# Patient Record
Sex: Female | Born: 2000
Health system: Southern US, Community
[De-identification: ages and names within clinical notes are randomized; demographics above are authoritative.]

## PROBLEM LIST (undated history)

## (undated) DIAGNOSIS — F988 Other specified behavioral and emotional disorders with onset usually occurring in childhood and adolescence: Secondary | ICD-10-CM

## (undated) DIAGNOSIS — F419 Anxiety disorder, unspecified: Secondary | ICD-10-CM

## (undated) DIAGNOSIS — F329 Major depressive disorder, single episode, unspecified: Secondary | ICD-10-CM

## (undated) DIAGNOSIS — R079 Chest pain, unspecified: Secondary | ICD-10-CM

## (undated) DIAGNOSIS — F32A Depression, unspecified: Secondary | ICD-10-CM

## (undated) DIAGNOSIS — S62109A Fracture of unspecified carpal bone, unspecified wrist, initial encounter for closed fracture: Secondary | ICD-10-CM

## (undated) HISTORY — DX: Anxiety disorder, unspecified: F41.9

## (undated) HISTORY — DX: Fracture of unspecified carpal bone, unspecified wrist, initial encounter for closed fracture: S62.109A

## (undated) HISTORY — DX: Chest pain, unspecified: R07.9

---
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
## 2018-01-05 NOTE — Telephone Encounter (Signed)
Received fax from [employer] that Viibryd was approved from 01/03/2018 through 01/03/2019. Pharmacy notified and forms sent to scan.   Reference ID: [id number].

## 2018-01-12 ENCOUNTER — Encounter: Payer: Self-pay | Admitting: Family Medicine

## 2018-01-12 MED ORDER — VILAZODONE HCL 10 & 20 MG PO KIT: 10.0000 mg | PACK | Freq: Every day | ORAL | 0 refills | Status: DC

## 2018-01-12 NOTE — Addendum Note (Signed)
Addended by: Arvilla Market on: 01/12/2018 11:44 AM   Modules accepted: Orders

## 2018-01-13 ENCOUNTER — Encounter: Payer: Self-pay | Admitting: *Deleted

## 2018-01-18 MED FILL — VIIBRYD 10-20 MG STARTER PA: 10 & 20 | 30 days supply | Qty: 30 | Fill #0

## 2018-02-10 ENCOUNTER — Ambulatory Visit (INDEPENDENT_AMBULATORY_CARE_PROVIDER_SITE_OTHER): Payer: 59 | Admitting: Family Medicine

## 2018-02-10 ENCOUNTER — Encounter: Payer: Self-pay | Admitting: Family Medicine

## 2018-02-10 VITALS — BP 122/60 | HR 90 | Ht 63.0 in | Wt 181.0 lb

## 2018-02-10 DIAGNOSIS — T50905A Adverse effect of unspecified drugs, medicaments and biological substances, initial encounter: Secondary | ICD-10-CM

## 2018-02-10 DIAGNOSIS — F332 Major depressive disorder, recurrent severe without psychotic features: Secondary | ICD-10-CM | POA: Diagnosis not present

## 2018-02-10 NOTE — Progress Notes (Signed)
   Subjective:    Patient ID: Anita Tanner, female    DOB: 03/04/2001, 17 y.o.   MRN: [id number]  HPI 6-week follow-up for major depressive disorder-we recently switched her to Viibryd.  We were fortunately able to get it covered under her insurance about 4 weeks ago.  She did have to withdraw from some of her summer classes because of the transition with her medication she was having some difficulty staying focused. She feels like she zones out.  She has been getting HA as well.  She describes it is a very mild low-grade headache.  She says it is occurring daily.  The most days it is not bad enough that she takes any medication for it.  She has been on the medication now for about 3 weeks total.  She also feels like sometimes she feels almost flat.  She does not quite feel the emotional highs and sometimes it was feels disconnected a little bit.  She has been off for the summer and she is not currently working or going to school.  Is been able to focus and has not had any difficulty with that.  Currently on the 20 mg dose of the Viibryd.  Review of Systems     Objective:   Physical Exam  Constitutional: She is oriented to person, place, and time. She appears well-developed and well-nourished.  HENT:  Head: Normocephalic and atraumatic.  Cardiovascular: Normal rate, regular rhythm and normal heart sounds.  Pulmonary/Chest: Effort normal and breath sounds normal.  Neurological: She is alert and oriented to person, place, and time.  Skin: Skin is warm and dry.  Psychiatric: She has a normal mood and affect. Her behavior is normal.        Assessment & Plan:  Major depressive disorder-right now she is doing okay on the 20 mg we will continue that for now.  Hopefully the headaches will improve over the next couple weeks.  Also if she feels that we need to increase her dose to 40 mg she can give me a call in a couple weeks or send a MyChart notice and we can adjust it.  Otherwise I will see  her back in 6 to 8 weeks.  I like to check back in before she starts school again.  I did provide a letter for her today stating that she is okay to return to fall classes.  Medication side effect-she is having some mild headaches will keep an eye on this.  Hopefully will continue to improve over the next couple weeks but if not we can certainly look at changing the medication if needed.

## 2018-02-20 ENCOUNTER — Encounter: Payer: Self-pay | Admitting: Family Medicine

## 2018-02-21 MED ORDER — VILAZODONE HCL 20 MG PO TABS: 20.0000 mg | ORAL_TABLET | Freq: Every day | ORAL | 1 refills | Status: DC

## 2018-02-21 MED FILL — VIIBRYD 20 MG TABLET: 20 | 30 days supply | Qty: 30 | Fill #0

## 2018-02-21 NOTE — Addendum Note (Signed)
Addended by: Arvilla MarketMABE, HOLDEN on: 02/21/2018 08:27 AM   Modules accepted: Orders

## 2018-03-16 DIAGNOSIS — F330 Major depressive disorder, recurrent, mild: Secondary | ICD-10-CM | POA: Diagnosis not present

## 2018-03-17 MED FILL — VIIBRYD 20 MG TABLET: 20 | 30 days supply | Qty: 30 | Fill #1

## 2018-03-28 ENCOUNTER — Other Ambulatory Visit: Payer: Self-pay | Admitting: Family Medicine

## 2018-03-28 MED FILL — ADDERALL XR 15 MG CAP SA: 15 | 90 days supply | Qty: 90 | Fill #0

## 2018-03-28 NOTE — Telephone Encounter (Signed)
Routed to pcp for signature.Anita Tanner, CMA  

## 2018-03-30 ENCOUNTER — Ambulatory Visit (INDEPENDENT_AMBULATORY_CARE_PROVIDER_SITE_OTHER): Payer: 59 | Admitting: Family Medicine

## 2018-03-30 ENCOUNTER — Encounter: Payer: Self-pay | Admitting: Family Medicine

## 2018-03-30 VITALS — BP 128/66 | HR 91 | Ht 63.0 in | Wt 180.0 lb

## 2018-03-30 DIAGNOSIS — F332 Major depressive disorder, recurrent severe without psychotic features: Secondary | ICD-10-CM

## 2018-03-30 DIAGNOSIS — G43909 Migraine, unspecified, not intractable, without status migrainosus: Secondary | ICD-10-CM

## 2018-03-30 DIAGNOSIS — F909 Attention-deficit hyperactivity disorder, unspecified type: Secondary | ICD-10-CM | POA: Diagnosis not present

## 2018-03-30 NOTE — Progress Notes (Signed)
Subjective:    Patient ID: Anita Tanner, female    DOB: 2000/12/14, 17 y.o.   MRN: [id number]  HPI Follow-up major depressive disorder-she is now currently on Viibryd.  She decided to stick with the 20 mg and feels like overall she is been doing well.  She moved back into the dorms in about a week and will start her fall semester of college.  She has not had any negative side effects that she has been having more frequent headaches she is not sure if it is related to the medicine or not.  ADHD - Reports symptoms are well controlled on current regime. Denies any problems with insomnia, chest pain, palpitations, or SOB.    Follow-up migraine headaches-she has been complaining of more frequent daily headaches not quite as intense as her usual migraines but they just seem to be low-grade and they are all day.  More so in this last week.  No specific triggers though she admits she often skips meals during the day and may not hydrate as well as she should.  Review of Systems BP 128/66   Pulse 91   Ht 5\' 3"  (1.6 m)   Wt 180 lb (81.6 kg)   SpO2 99%   BMI 31.89 kg/m     Allergies  Allergen Reactions  . Prozac [Fluoxetine Hcl] Other (See Comments)    Weight gain    Past Medical History:  Diagnosis Date  . ADD (attention deficit disorder)   . Depression   . Fracture of wrist    left 2008, bilat 2011, right 2013    Past Surgical History:  Procedure Laterality Date  . TYMPANOSTOMY TUBE PLACEMENT  2002    Social History   Socioeconomic History  . Marital status: Single    Spouse name: Not on file  . Number of children: Not on file  . Years of education: Not on file  . Highest education level: Not on file  Occupational History  . Not on file  Social Needs  . Financial resource strain: Not on file  . Food insecurity:    Worry: Not on file    Inability: Not on file  . Transportation needs:    Medical: Not on file    Non-medical: Not on file  Tobacco Use  . Smoking  status: Never Smoker  . Smokeless tobacco: Never Used  Substance and Sexual Activity  . Alcohol use: No  . Drug use: No  . Sexual activity: Yes    Birth control/protection: Pill  Lifestyle  . Physical activity:    Days per week: Not on file    Minutes per session: Not on file  . Stress: Not on file  Relationships  . Social connections:    Talks on phone: Not on file    Gets together: Not on file    Attends religious service: Not on file    Active member of club or organization: Not on file    Attends meetings of clubs or organizations: Not on file    Relationship status: Not on file  . Intimate partner violence:    Fear of current or ex partner: Not on file    Emotionally abused: Not on file    Physically abused: Not on file    Forced sexual activity: Not on file  Other Topics Concern  . Not on file  Social History Narrative   No sexually active.  In new school.      Family History  Problem  Relation Age of Onset  . Coronary artery disease Father   . ADD / ADHD Father     Outpatient Encounter Medications as of 03/30/2018  Medication Sig  . amphetamine-dextroamphetamine (ADDERALL XR) 15 MG 24 hr capsule TAKE 1 CAPSULE BY MOUTH EVERY MORNING.  . clindamycin-benzoyl peroxide (BENZACLIN) gel Apply topically 2 (two) times daily.  Marland Kitchen. ipratropium (ATROVENT) 0.06 % nasal spray Place 1 spray into both nostrils 4 (four) times daily as needed.  . Multiple Vitamin (MULTIVITAMIN WITH MINERALS) TABS tablet Take 1 tablet by mouth daily.  . Vilazodone HCl 20 MG TABS Take 1 tablet (20 mg total) by mouth daily.   No facility-administered encounter medications on file as of 03/30/2018.          Objective:   Physical Exam  Constitutional: She is oriented to person, place, and time. She appears well-developed and well-nourished.  HENT:  Head: Normocephalic and atraumatic.  Cardiovascular: Normal rate, regular rhythm and normal heart sounds.  Pulmonary/Chest: Effort normal and breath  sounds normal.  Neurological: She is alert and oriented to person, place, and time.  Skin: Skin is warm and dry.  Psychiatric: She has a normal mood and affect. Her behavior is normal.       Assessment & Plan:  Depressive disorder-PHQ 9 score of 7 and gad 7 score of 3.  We discussed options she wants to continue with her current dosing regimen.  Follow-up in 3 months.  She can always send me a note through my chart if she decides that she needs an increased dose once she is back into school and under typical pressures and stressors of her classes.  ADHD -  Doing well on current regimen. F/U in 3 mo. 90-day supply was refilled yesterday.  Migraine headaches-it sounds like she is getting more tension type headaches on top of her migraines.  We discussed prophylaxis as an option.  But right now I really want her to focus on lifestyle including eating more regularly, hydrating well, staying active, getting adequate sleep and going to bed at the same time.  She agreed that focusing on that first would be her priority as well.

## 2018-04-21 ENCOUNTER — Other Ambulatory Visit: Payer: Self-pay | Admitting: Family Medicine

## 2018-04-22 MED FILL — VIIBRYD 20 MG TABLET: 20 | 30 days supply | Qty: 30 | Fill #0

## 2018-05-18 MED FILL — VIIBRYD 20 MG TABLET: 20 | 30 days supply | Qty: 30 | Fill #1

## 2018-06-03 MED FILL — CLINDAMYCIN PHOS-BENZOYL PE: 1-5 | 30 days supply | Qty: 50 | Fill #1

## 2018-06-27 ENCOUNTER — Other Ambulatory Visit: Payer: Self-pay | Admitting: Family Medicine

## 2018-06-27 MED FILL — VIIBRYD 20 MG TABLET: 20 | 30 days supply | Qty: 30 | Fill #0

## 2018-06-30 ENCOUNTER — Ambulatory Visit: Payer: 59 | Admitting: Family Medicine

## 2018-06-30 ENCOUNTER — Encounter: Payer: Self-pay | Admitting: Family Medicine

## 2018-06-30 VITALS — BP 115/56 | HR 92 | Ht 62.21 in | Wt 177.0 lb

## 2018-06-30 DIAGNOSIS — Z23 Encounter for immunization: Secondary | ICD-10-CM

## 2018-06-30 DIAGNOSIS — F909 Attention-deficit hyperactivity disorder, unspecified type: Secondary | ICD-10-CM

## 2018-06-30 DIAGNOSIS — F332 Major depressive disorder, recurrent severe without psychotic features: Secondary | ICD-10-CM

## 2018-06-30 MED ORDER — AMPHETAMINE-DEXTROAMPHET ER 15 MG PO CP24: ORAL_CAPSULE | ORAL | 0 refills | Status: DC

## 2018-06-30 MED FILL — ADDERALL XR 15 MG CAP SA: 15 | 90 days supply | Qty: 90 | Fill #0

## 2018-06-30 NOTE — Progress Notes (Signed)
Subjective:    CC:   HPI: ADHD - Reports symptoms are well controlled on current regime. Denies any problems with insomnia, chest pain, palpitations, or SOB.  She does need refills on her medication.  Is getting close to the end of the school semester and she is been under a lot of stress.  Major  depressive disorder-she feels like she is actually doing well on the Viibryd 20 mg.  She is happy with her regimen even though she does feel like her stress levels have increased.  She more recently has started working out and has been going to the gym with a friend on Wednesdays she is actually dropped about 3 pounds since she was here last time.    Past medical history, Surgical history, Family history not pertinant except as noted below, Social history, Allergies, and medications have been entered into the medical record, reviewed, and corrections made.   Review of Systems: No fevers, chills, night sweats, weight loss, chest pain, or shortness of breath.   Objective:    General: Well Developed, well nourished, and in no acute distress.  Neuro: Alert and oriented x3, extra-ocular muscles intact, sensation grossly intact.  HEENT: Normocephalic, atraumatic  Skin: Warm and dry, no rashes. Cardiac: Regular rate and rhythm, no murmurs rubs or gallops, no lower extremity edema.  Respiratory: Clear to auscultation bilaterally. Not using accessory muscles, speaking in full sentences.   Impression and Recommendations:    ADHD -doing well on current regimen.  Blood pressure under control.  Refilled Adderall today for 90-day supply.  Follow-up in 3 months.  MDD -overall doing well.  Just encouraged her to really push through the rest of the semester and do her best.  I am glad that she is exercising and working out and glad she is lost a few pounds this is fantastic.

## 2018-07-25 MED FILL — VIIBRYD 20 MG TABLET: 20 | 30 days supply | Qty: 30 | Fill #1

## 2018-08-03 ENCOUNTER — Encounter: Payer: Self-pay | Admitting: Family Medicine

## 2018-08-07 MED ORDER — BUPROPION HCL ER (XL) 150 MG PO TB24: 150.0000 mg | ORAL_TABLET | ORAL | 2 refills | Status: DC

## 2018-08-07 NOTE — Addendum Note (Signed)
Addended by: Nani GasserMETHENEY, Lakita D on: 08/07/2018 10:13 AM   Modules accepted: Orders

## 2018-08-08 MED FILL — buPROPion HCL ER (XL) 150 M: 150 | 30 days supply | Qty: 30 | Fill #0

## 2018-08-19 ENCOUNTER — Ambulatory Visit (INDEPENDENT_AMBULATORY_CARE_PROVIDER_SITE_OTHER): Payer: 59 | Admitting: Physician Assistant

## 2018-08-19 ENCOUNTER — Encounter: Payer: Self-pay | Admitting: Physician Assistant

## 2018-08-19 VITALS — BP 115/73 | HR 95 | Temp 98.7°F | Wt 182.0 lb

## 2018-08-19 DIAGNOSIS — H6501 Acute serous otitis media, right ear: Secondary | ICD-10-CM

## 2018-08-19 DIAGNOSIS — J069 Acute upper respiratory infection, unspecified: Secondary | ICD-10-CM | POA: Diagnosis not present

## 2018-08-19 MED ORDER — AMOXICILLIN 875 MG PO TABS: 875.0000 mg | ORAL_TABLET | Freq: Two times a day (BID) | ORAL | 0 refills | Status: AC

## 2018-08-19 MED ORDER — GUAIFENESIN ER 600 MG PO TB12: 1200.0000 mg | ORAL_TABLET | Freq: Two times a day (BID) | ORAL | Status: DC

## 2018-08-19 MED ORDER — IPRATROPIUM BROMIDE 0.06 % NA SOLN: 1.0000 | Freq: Four times a day (QID) | NASAL | 0 refills | Status: DC | PRN

## 2018-08-19 MED ORDER — HYDROCOD POLST-CPM POLST ER 10-8 MG/5ML PO SUER: 5.0000 mL | Freq: Two times a day (BID) | ORAL | 0 refills | Status: AC | PRN

## 2018-08-19 MED FILL — HYDROCODONE-CHLORPHEN ER SU: 10-8 | 5 days supply | Qty: 50 | Fill #0

## 2018-08-19 MED FILL — IPRATROPIUM 0.06% SPRAY: 0.06 | 21 days supply | Qty: 15 | Fill #0

## 2018-08-19 MED FILL — AMOXICILLIN 875 MG TABS: 875 | 7 days supply | Qty: 14 | Fill #0

## 2018-08-19 NOTE — Progress Notes (Signed)
HPI:                                                                Anita Tanner is a 17 y.o. female [facility] today for URI symptoms  Cough  This is a new problem. The current episode started in the past 7 days. The problem has been unchanged. Associated symptoms include ear congestion (b/l), nasal congestion and postnasal drip. Nothing aggravates the symptoms. She has tried OTC cough suppressant and rest for the symptoms. There is no history of asthma or COPD.     Past Medical History:  Diagnosis Date  . ADD (attention deficit disorder)   . Depression   . Fracture of wrist    left 2008, bilat 2011, right 2013   Past Surgical History:  Procedure Laterality Date  . TYMPANOSTOMY TUBE PLACEMENT  2002   Social History   Tobacco Use  . Smoking status: Never Smoker  . Smokeless tobacco: Never Used  Substance Use Topics  . Alcohol use: No   family history includes ADD / ADHD in her father; Coronary artery disease in her father.    ROS: negative except as noted in the HPI  Medications: Current Outpatient Medications  Medication Sig Dispense Refill  . amoxicillin (AMOXIL) 875 MG tablet Take 1 tablet (875 mg total) by mouth 2 (two) times daily for 7 days. 14 tablet 0  . amphetamine-dextroamphetamine (ADDERALL XR) 15 MG 24 hr capsule TAKE 1 CAPSULE BY MOUTH EVERY MORNING. 90 capsule 0  . buPROPion (WELLBUTRIN XL) 150 MG 24 hr tablet Take 1 tablet (150 mg total) by mouth every morning. 30 tablet 2  . chlorpheniramine-HYDROcodone (TUSSIONEX) 10-8 MG/5ML SUER Take 5 mLs by mouth every 12 (twelve) hours as needed for up to 5 days for cough. 50 mL 0  . clindamycin-benzoyl peroxide (BENZACLIN) gel Apply topically 2 (two) times daily. 35 g PRN  . guaiFENesin (MUCINEX) 600 MG 12 hr tablet Take 2 tablets (1,200 mg total) by mouth 2 (two) times daily.    Marland Kitchen. ipratropium (ATROVENT) 0.06 % nasal spray Place 1 spray into  both nostrils 4 (four) times daily as needed. 15 mL 0  . Multiple Vitamin (MULTIVITAMIN WITH MINERALS) TABS tablet Take 1 tablet by mouth daily.    Marland Kitchen. VIIBRYD 20 MG TABS TAKE 1 TABLET (20 MG TOTAL) BY MOUTH DAILY. 30 tablet 1   No current facility-administered medications for this visit.    Allergies  Allergen Reactions  . Prozac [Fluoxetine Hcl] Other (See Comments)    Weight gain       Objective:  BP 115/73   Pulse 95   Temp 98.7 F (37.1 C) (Oral)   Wt 182 lb (82.6 kg)   SpO2 97%   BMI 33.07 kg/m  Gen:  alert, not ill-appearing, no distress, appropriate for age HEENT: head normocephalic without obvious abnormality, conjunctiva and cornea clear, right TM is red nonbulging, left TM pearly gray and semitransparent, external ear canals normal bilaterally, oropharynx clear, no exudates, uvula midline, neck supple, no cervical adenopathy trachea midline Pulm: Normal work of breathing, normal phonation, clear to auscultation bilaterally, no wheezes, rales or rhonchi CV: Normal rate, regular rhythm, s1 and s2 distinct, no murmurs, clicks or  rubs  Neuro: alert and oriented x 3, no tremor MSK: extremities atraumatic, normal gait and station Skin: intact, no rashes on exposed skin, no jaundice, no cyanosis   No results found for this or any previous visit (from the past 72 hour(s)). No results found.    Assessment and Plan: 17 y.o. female with   .Anita Tanner was seen today for cough.  Diagnoses and all orders for this visit:  Non-recurrent acute serous otitis media of right ear -     amoxicillin (AMOXIL) 875 MG tablet; Take 1 tablet (875 mg total) by mouth 2 (two) times daily for 7 days.  Acute upper respiratory infection -     ipratropium (ATROVENT) 0.06 % nasal spray; Place 1 spray into both nostrils 4 (four) times daily as needed. -     chlorpheniramine-HYDROcodone (TUSSIONEX) 10-8 MG/5ML SUER; Take 5 mLs by mouth every 12 (twelve) hours as needed for up to 5 days for  cough. -     guaiFENesin (MUCINEX) 600 MG 12 hr tablet; Take 2 tablets (1,200 mg total) by mouth 2 (two) times daily.   Afebrile, no tachypnea, no tachycardia, pulse ox 97% on room air at rest, no adventitious lung sounds Treating for early otitis media with amoxicillin Counseled on supportive care for cough Continue Mucinex Tussionex for nocturnal cough  Patient education and anticipatory guidance given Patient agrees with treatment plan Follow-up as needed if symptoms worsen or fail to improve  Levonne Hubertharley E. Mendy Chou PA-C

## 2018-08-19 NOTE — Patient Instructions (Signed)
For nasal symptoms/sinusitis: - nasal saline rinses / netti pot several times per day (do this prior to nasal spray) - prescription Atrovent nasal spray: 2 sprays each nostril, up to 4 times per day as needed - you can use OTC nasal decongestant sprays like Afrin (oxymetazoline) BUT do not use for more than 3 days as it will cause worsening congestion/nasal symptoms) - warm facial compresses - oral decongestants and antihistamines like Claritin-D and Zyrtec-D may help dry up secretions (caution using decongestants if you have high blood pressure, heart disease or kidney disease) - for sinus headache: Tylenol 1000mg  every 8 hours as needed. Alternate with Ibuprofen 600mg  every 6 hours  For sore throat: - Tylenol 1000mg  every 8 hours as needed for throat pain. Alternate with Ibuprofen 600mg  every 6 hours - Cepacol throat lozenges and/or Chloraseptic spray - Warm salt water gargles  For cough: - Cough is a protective mechanism and an important part of fighting an infection. I encourage you to avoid suppressing your cough with medication during the day if possible.  - Mucinex with at least 8 oz. of water can loosen chest congestion and make cough more productive, which means you will actually cough less - Okay to use a cough suppressant at bedtime in order to rest   Note: follow package instructions for all over-the-counter medications. If using multi-symptom medications (Dayquil, Theraflu, etc.), check the label for duplicate drug ingredients.   Otitis Media, Adult  Otitis media occurs when there is inflammation and fluid in the middle ear. Your middle ear is a part of the ear that contains bones for hearing as well as air that helps send sounds to your brain. What are the causes? This condition is caused by a blockage in the eustachian tube. This tube drains fluid from the ear to the back of the nose (nasopharynx). A blockage in this tube can be caused by an object or by swelling (edema) in  the tube. Problems that can cause a blockage include:  A cold or other upper respiratory infection.  Allergies.  An irritant, such as tobacco smoke.  Enlarged adenoids. The adenoids are areas of soft tissue located high in the back of the throat, behind the nose and the roof of the mouth.  A mass in the nasopharynx.  Damage to the ear caused by pressure changes (barotrauma). What are the signs or symptoms? Symptoms of this condition include:  Ear pain.  A fever.  Decreased hearing.  A headache.  Tiredness (lethargy).  Fluid leaking from the ear.  Ringing in the ear. How is this diagnosed? This condition is diagnosed with a physical exam. During the exam your health care provider will use an instrument called an otoscope to look into your ear and check for redness, swelling, and fluid. He or she will also ask about your symptoms. Your health care provider may also order tests, such as:  A test to check the movement of the eardrum (pneumatic otoscopy). This test is done by squeezing a small amount of air into the ear.  A test that changes air pressure in the middle ear to check how well the eardrum moves and whether the eustachian tube is working (tympanogram). How is this treated? This condition usually goes away on its own within 3-5 days. But if the condition is caused by a bacteria infection and does not go away own its own, or keeps coming back, your health care provider may:  Prescribe antibiotic medicines to treat the infection.  Prescribe  or recommend medicines to control pain. Follow these instructions at home:  Take over-the-counter and prescription medicines only as told by your health care provider.  If you were prescribed an antibiotic medicine, take it as told by your health care provider. Do not stop taking the antibiotic even if you start to feel better.  Keep all follow-up visits as told by your health care provider. This is important. Contact a health  care provider if:  You have bleeding from your nose.  There is a lump on your neck.  You are not getting better in 5 days.  You feel worse instead of better. Get help right away if:  You have severe pain that is not controlled with medicine.  You have swelling, redness, or pain around your ear.  You have stiffness in your neck.  A part of your face is paralyzed.  The bone behind your ear (mastoid) is tender when you touch it.  You develop a severe headache. Summary  Otitis media is redness, soreness, and swelling of the middle ear.  This condition usually goes away on its own within 3-5 days.  If the problem does not go away in 3-5 days, your health care provider may prescribe or recommend medicines to treat your symptoms.  If you were prescribed an antibiotic medicine, take it as told by your health care provider. This information is not intended to replace advice given to you by your health care provider. Make sure you discuss any questions you have with your health care provider. Document Released: 05/15/2004 Document Revised: 07/31/2016 Document Reviewed: 07/31/2016 Elsevier Interactive Patient Education  2019 [employer]

## 2018-09-12 ENCOUNTER — Other Ambulatory Visit: Payer: Self-pay | Admitting: Family Medicine

## 2018-09-12 MED FILL — VIIBRYD 20 MG TABLET: 20 | 90 days supply | Qty: 90 | Fill #0

## 2018-09-12 MED FILL — buPROPion HCL ER (XL) 150 M: 150 | 30 days supply | Qty: 30 | Fill #1

## 2018-09-27 ENCOUNTER — Encounter: Payer: Self-pay | Admitting: Family Medicine

## 2018-09-27 ENCOUNTER — Ambulatory Visit (INDEPENDENT_AMBULATORY_CARE_PROVIDER_SITE_OTHER): Payer: 59 | Admitting: Family Medicine

## 2018-09-27 VITALS — BP 138/76 | HR 76 | Ht 62.0 in | Wt 183.0 lb

## 2018-09-27 DIAGNOSIS — N898 Other specified noninflammatory disorders of vagina: Secondary | ICD-10-CM | POA: Diagnosis not present

## 2018-09-27 DIAGNOSIS — F909 Attention-deficit hyperactivity disorder, unspecified type: Secondary | ICD-10-CM | POA: Diagnosis not present

## 2018-09-27 DIAGNOSIS — E663 Overweight: Secondary | ICD-10-CM | POA: Diagnosis not present

## 2018-09-27 DIAGNOSIS — F332 Major depressive disorder, recurrent severe without psychotic features: Secondary | ICD-10-CM

## 2018-09-27 MED ORDER — DOXYCYCLINE HYCLATE 100 MG PO TABS: 100.0000 mg | ORAL_TABLET | Freq: Two times a day (BID) | ORAL | 0 refills | Status: DC

## 2018-09-27 MED FILL — DOXYCYCLINE HYCLATE 100 MG: 100 | 10 days supply | Qty: 20 | Fill #0

## 2018-09-27 NOTE — Progress Notes (Signed)
Subjective:    CC: F/U Mood and ADHD  HPI: ADHD - Reports symptoms are well controlled on current regime. Denies any problems with insomnia, chest pain, palpitations, or SOB.  Has been a little stressed and overwhelmed with school.  She has 2 projects in a test next week and is just feeling a little overwhelmed she has not made a lot of progress on the projects.  Otherwise she does feel like her medication is working and it is effective.  Major depressive disorder-doing really well on Viibryd.  She is happy with her regimen overall.  She is still unhappy with her weight.  She really has not lost any weight since I last saw her.  But she also admits that she has not been working out like she was and she is still struggling with figuring out what she should eat and a good balance of foods.  Reports that she is had a vaginal odor for months.  She is not had any abnormal discharge or irritation.  She says she washes regularly.  She stays away from douching.  But just has noticed an increased odor.   Past medical history, Surgical history, Family history not pertinant except as noted below, Social history, Allergies, and medications have been entered into the medical record, reviewed, and corrections made.   Review of Systems: No fevers, chills, night sweats, weight loss, chest pain, or shortness of breath.   Objective:    General: Well Developed, well nourished, and in no acute distress.  Neuro: Alert and oriented x3, extra-ocular muscles intact, sensation grossly intact.  HEENT: Normocephalic, atraumatic  Skin: Warm and dry, no rashes. Cardiac: Regular rate and rhythm, no murmurs rubs or gallops, no lower extremity edema.  Respiratory: Clear to auscultation bilaterally. Not using accessory muscles, speaking in full sentences.   Impression and Recommendations:    ADHD - Stable. On current regimen.    MDD - Stable on current regimen.    Vaginal odor-we will do a wet prep as well as check  for GC and chlamydia.  Overweight -we discussed options including referral to a nutritionist versus referral to the bariatric center.  She says right now she is just not sure about the time commitment that it may take.  She does plan on getting back into her exercise routine and says she knows she needs to work on that.  She has a couple of friends that she does exercise with.  We also discussed not skipping meals and maybe even using things like a protein drink to help supplement.  She does not often eat breakfast.

## 2018-09-28 LAB — WET PREP FOR TRICH, YEAST, CLUE
MICRO NUMBER: 148943
SPECIMEN QUALITY 3963: ADEQUATE

## 2018-09-28 LAB — C. TRACHOMATIS/N. GONORRHOEAE RNA
C. trachomatis RNA, TMA: NOT DETECTED
N. gonorrhoeae RNA, TMA: NOT DETECTED

## 2018-10-18 MED FILL — buPROPion HCL ER (XL) 150 M: 150 | 30 days supply | Qty: 30 | Fill #2

## 2018-10-24 ENCOUNTER — Other Ambulatory Visit: Payer: Self-pay | Admitting: Family Medicine

## 2018-11-04 MED FILL — ADDERALL XR 15 MG CAP SA: 15 | 90 days supply | Qty: 90 | Fill #0

## 2018-11-22 ENCOUNTER — Encounter: Payer: Self-pay | Admitting: Family Medicine

## 2018-11-22 MED ORDER — BUPROPION HCL ER (XL) 150 MG PO TB24: 150.0000 mg | ORAL_TABLET | ORAL | 0 refills | Status: DC

## 2018-11-22 MED FILL — buPROPion HCL ER (XL) 150 M: 150 | 90 days supply | Qty: 90 | Fill #0

## 2019-01-11 MED FILL — VIIBRYD 20 MG TABLET: 20 | 90 days supply | Qty: 90 | Fill #1

## 2019-01-26 ENCOUNTER — Encounter: Payer: Self-pay | Admitting: Family Medicine

## 2019-01-26 ENCOUNTER — Telehealth (INDEPENDENT_AMBULATORY_CARE_PROVIDER_SITE_OTHER): Payer: 59 | Admitting: Family Medicine

## 2019-01-26 DIAGNOSIS — F332 Major depressive disorder, recurrent severe without psychotic features: Secondary | ICD-10-CM

## 2019-01-26 DIAGNOSIS — F909 Attention-deficit hyperactivity disorder, unspecified type: Secondary | ICD-10-CM | POA: Diagnosis not present

## 2019-01-26 DIAGNOSIS — L709 Acne, unspecified: Secondary | ICD-10-CM | POA: Diagnosis not present

## 2019-01-26 MED ORDER — VILAZODONE HCL 20 MG PO TABS: ORAL_TABLET | ORAL | 1 refills | Status: DC

## 2019-01-26 MED ORDER — CLINDAMYCIN PHOS-BENZOYL PEROX 1-5 % EX GEL: Freq: Two times a day (BID) | CUTANEOUS | 99 refills | Status: DC

## 2019-01-26 MED ORDER — AMPHETAMINE-DEXTROAMPHET ER 15 MG PO CP24: ORAL_CAPSULE | ORAL | 0 refills | Status: DC

## 2019-01-26 MED FILL — CLINDAMYCIN PHOS-BENZOYL PE: 1-5 | 30 days supply | Qty: 50 | Fill #0

## 2019-01-26 NOTE — Progress Notes (Signed)
Virtual Visit via Video Note  I connected with Anita Tanner on 01/26/19 at  1:40 PM EDT by a video enabled telemedicine application and verified that I am speaking with the correct person using two identifiers.   I discussed the limitations of evaluation and management by telemedicine and the availability of in person appointments. The patient expressed understanding and agreed to proceed.  Pt was at home and I was in my office for the virtual visit.     Subjective:    CC: ADHD  HPI: F/U MDD - she is doing well on her Viibryd and her Wellbutrin  ADHD - Reports symptoms are well controlled on current regime. Denies any problems with insomnia, chest pain, palpitations, or SOB.    Acne - acne has been worse for awhile.  She needs a refill on the benzaclin gel. Doesn't use it daily bc it is really drying.  More on her cheeks or her forehead. Uses cetaphil.   Past medical history, Surgical history, Family history not pertinant except as noted below, Social history, Allergies, and medications have been entered into the medical record, reviewed, and corrections made.   Review of Systems: No fevers, chills, night sweats, weight loss, chest pain, or shortness of breath.   Objective:    General: Speaking clearly in complete sentences without any shortness of breath.  Alert and oriented x3.  Normal judgment. No apparent acute distress. Well groomed.     Impression and Recommendations:   ADHD -doing really well.  We will go ahead and send refill to pharmacy for the next 90 days.  She says she may not take it every single day over the summer but she has been taking it most days.  Acne -little bit of breakout particularly over the cheeks and the forehead.  We discussed that we could always do a low-dose Doxy for couple weeks if needed.  She is going to try to use the gel just a little bit more often though it is extremely drying.  Also encouraged her to make sure she is moisturizing well  there is a good line of Cetaphil products that can be used.  She says she has used those before.  MDD -would overall is okay.  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  But she is getting ready to go on vacation with her father to the beach and she is looking forward to that.  She is happy with her current regimen and would like to continue that.      I discussed the assessment and treatment plan with the patient. The patient was provided an opportunity to ask questions and all were answered. The patient agreed with the plan and demonstrated an understanding of the instructions.   The patient was advised to call back or seek an in-person evaluation if the symptoms worsen or if the condition fails to improve as anticipated.   [name], MD

## 2019-01-26 NOTE — Progress Notes (Signed)
Pt is doing well on current regimen.Jaquanna Ballentine Lynetta, CMA  

## 2019-02-07 ENCOUNTER — Encounter: Payer: Self-pay | Admitting: Family Medicine

## 2019-02-16 MED FILL — ADDERALL XR 15 MG CAP SA: 15 | 90 days supply | Qty: 90 | Fill #0

## 2019-03-22 ENCOUNTER — Other Ambulatory Visit: Payer: Self-pay

## 2019-03-22 MED ORDER — BUPROPION HCL ER (XL) 150 MG PO TB24: 150.0000 mg | ORAL_TABLET | ORAL | 0 refills | Status: DC

## 2019-03-22 MED FILL — buPROPion HCL ER (XL) 150 M: 150 | 90 days supply | Qty: 90 | Fill #0

## 2019-04-13 ENCOUNTER — Other Ambulatory Visit: Payer: Self-pay | Admitting: Family Medicine

## 2019-04-13 MED FILL — VIIBRYD 20 MG TABLET: 20 | 90 days supply | Qty: 90 | Fill #0

## 2019-04-13 NOTE — Telephone Encounter (Signed)
Called and verified and it is for the 20mg  so denied this rx as they have refillls. KG LPN

## 2019-04-13 NOTE — Telephone Encounter (Signed)
Can we please clarify.  This prescription is for a starter pack but per my records she should already be taking 20 mg daily and a 47-month supply was sent in June.  So she should have enough until December.

## 2019-05-16 ENCOUNTER — Encounter: Payer: Self-pay | Admitting: Family Medicine

## 2019-05-16 ENCOUNTER — Telehealth: Payer: 59 | Admitting: Physician Assistant

## 2019-05-16 DIAGNOSIS — J029 Acute pharyngitis, unspecified: Secondary | ICD-10-CM

## 2019-05-16 DIAGNOSIS — R0981 Nasal congestion: Secondary | ICD-10-CM

## 2019-05-16 MED ORDER — IPRATROPIUM BROMIDE 0.03 % NA SOLN: 2.0000 | Freq: Three times a day (TID) | NASAL | 12 refills | Status: AC | PRN

## 2019-05-16 MED ORDER — PHENOL 1.4 % MT LIQD: 1.0000 | OROMUCOSAL | 0 refills | Status: DC | PRN

## 2019-05-16 MED FILL — IPRATROPIUM 0.03% SPRAY: 0.03 | 29 days supply | Qty: 30 | Fill #0

## 2019-05-16 NOTE — Progress Notes (Signed)
We are sorry you are not feeling well.  Here is how we plan to help!  Based on what you have shared with me, it looks like you may have a viral upper respiratory infection.  Upper respiratory infections are caused by a large number of viruses; however, rhinovirus is the most common cause.  *Your symptoms could suggest possible COVID-19 infection.  Many health care providers can now test patients at their office but not all are.  [facility] has multiple testing sites. For information on our COVID testing locations and hours go to [email or web address]   You can also call  [phone] for further information regarding local testing options offered through [facility].    Symptoms vary from person to person, with common symptoms including sore throat, cough, fatigue or lack of energy and feeling of general discomfort.  A low-grade fever of up to 100.4 may present, but is often uncommon.  Symptoms vary however, and are closely related to a person's age or underlying illnesses.  The most common symptoms associated with an upper respiratory infection are nasal discharge or congestion, cough, sneezing, headache and pressure in the ears and face.  These symptoms usually persist for about 3 to 10 days, but can last up to 2 weeks.  It is important to know that upper respiratory infections do not cause serious illness or complications in most cases.    Upper respiratory infections can be transmitted from person to person, with the most common method of transmission being a person's hands.  The virus is able to live on the skin and can infect other persons for up to 2 hours after direct contact.  Also, these can be transmitted when someone coughs or sneezes; thus, it is important to cover the mouth to reduce this risk.  To keep the spread of the illness at [place], good hand hygiene is very important.  This is an infection that is most likely caused by a virus. There are no specific  treatments other than to help you with the symptoms until the infection runs its course.  We are sorry you are not feeling well.  Here is how we plan to help!   For nasal congestion, you may use an oral decongestants such as Mucinex D or if you have glaucoma or high blood pressure use plain Mucinex.  Saline nasal spray or nasal drops can help and can safely be used as often as needed for congestion.  For your congestion, I have prescribed Ipratropium Bromide nasal spray 0.03% two sprays in each nostril 2-3 times a day  If you do not have a history of heart disease, hypertension, diabetes or thyroid disease, prostate/bladder issues or glaucoma, you may also use Sudafed to treat nasal congestion.  It is highly recommended that you consult with a pharmacist or your primary care physician to ensure this medication is safe for you to take.     If you have a cough, you may use cough suppressants such as Delsym and Robitussin.  If you have glaucoma or high blood pressure, you can also use Coricidin HBP.    If you have a sore or scratchy throat, use a saltwater gargle-  to  teaspoon of salt dissolved in a 4-ounce to 8-ounce glass of warm water.  Gargle the solution for approximately 15-30 seconds and then spit.  It is important not to swallow the solution.  You can also use throat lozenges/cough drops and Chloraseptic spray (which I have prescribed for you) to help with  throat pain or discomfort.  Warm or cold liquids can also be helpful in relieving throat pain.  For headache, pain or general discomfort, you can use Ibuprofen or Tylenol as directed.   Some authorities believe that zinc sprays or the use of Echinacea may shorten the course of your symptoms.   HOME CARE . Only take medications as instructed by your medical team. . Be sure to drink plenty of fluids. Water is fine as well as fruit juices, sodas and electrolyte beverages. You may want to stay away from caffeine or alcohol. If you are  nauseated, try taking small sips of liquids. How do you know if you are getting enough fluid? Your urine should be a pale yellow or almost colorless. . Get rest. . Taking a steamy shower or using a humidifier may help nasal congestion and ease sore throat pain. You can place a towel over your head and breathe in the steam from hot water coming from a faucet. . Using a saline nasal spray works much the same way. . Cough drops, hard candies and sore throat lozenges may ease your cough. . Avoid close contacts especially the very young and the elderly . Cover your mouth if you cough or sneeze . Always remember to wash your hands.   GET HELP RIGHT AWAY IF: . You develop worsening fever. . If your symptoms do not improve within 10 days . You develop yellow or green discharge from your nose over 3 days. . You have coughing fits . You develop a severe head ache or visual changes. . You develop shortness of breath, difficulty breathing or start having chest pain . Your symptoms persist after you have completed your treatment plan  MAKE SURE YOU   Understand these instructions.  Will watch your condition.  Will get help right away if you are not doing well or get worse.  Your e-visit answers were reviewed by a board certified advanced clinical practitioner to complete your personal care plan. Depending upon the condition, your plan could have included both over the counter or prescription medications. Please review your pharmacy choice. If there is a problem, you may call our nursing hot line at and have the prescription routed to another pharmacy. Your safety is important to [place]. If you have drug allergies check your prescription carefully.   You can use MyChart to ask questions about today's visit, request a non-urgent call back, or ask for a work or school excuse for 24 hours related to this e-Visit. If it has been greater than 24 hours you will need to follow up with your provider, or enter a  new e-Visit to address those concerns. You will get an e-mail in the next two days asking about your experience.  I hope that your e-visit has been valuable and will speed your recovery. Thank you for using e-visits.  Greater than 5 minutes, yet less than 10 minutes of time have been spent researching, coordinating, and implementing care for this patient today.

## 2019-05-19 ENCOUNTER — Encounter: Payer: Self-pay | Admitting: Family Medicine

## 2019-05-20 ENCOUNTER — Other Ambulatory Visit: Payer: Self-pay

## 2019-05-20 ENCOUNTER — Emergency Department
Admission: EM | Admit: 2019-05-20 | Discharge: 2019-05-20 | Disposition: A | Discharge status: Home / Self Care | Payer: 59 | Source: Home / Self Care | Attending: Family Medicine | Admitting: Family Medicine

## 2019-05-20 ENCOUNTER — Encounter: Payer: Self-pay | Admitting: Emergency Medicine

## 2019-05-20 DIAGNOSIS — J029 Acute pharyngitis, unspecified: Secondary | ICD-10-CM | POA: Diagnosis not present

## 2019-05-20 DIAGNOSIS — B9789 Other viral agents as the cause of diseases classified elsewhere: Secondary | ICD-10-CM | POA: Diagnosis not present

## 2019-05-20 DIAGNOSIS — R05 Cough: Secondary | ICD-10-CM

## 2019-05-20 DIAGNOSIS — H6502 Acute serous otitis media, left ear: Secondary | ICD-10-CM

## 2019-05-20 DIAGNOSIS — J069 Acute upper respiratory infection, unspecified: Secondary | ICD-10-CM

## 2019-05-20 LAB — POCT RAPID STREP A (OFFICE): Rapid Strep A Screen: NEGATIVE

## 2019-05-20 MED ORDER — PREDNISONE 20 MG PO TABS: ORAL_TABLET | ORAL | 0 refills | Status: DC

## 2019-05-20 MED ORDER — AMOXICILLIN 875 MG PO TABS: 875.0000 mg | ORAL_TABLET | Freq: Two times a day (BID) | ORAL | 0 refills | Status: DC

## 2019-05-20 NOTE — Discharge Instructions (Addendum)
Take plain guaifenesin (1200mg  extended release tabs such as Mucinex) twice daily, with plenty of water, for cough and congestion.  May add Pseudoephedrine (30mg , one or two every 4 to 6 hours) for sinus congestion.  Get adequate rest.   May use Afrin nasal spray (or generic oxymetazoline) each morning for about 5 days and then discontinue.  Also recommend using saline nasal spray several times daily and saline nasal irrigation (AYR is a common brand).  Use Flonase nasal spray each morning after using Afrin nasal spray and saline nasal irrigation. Try warm salt water gargles for sore throat.  Stop all antihistamines for now, and other non-prescription cough/cold preparations (Nyquil, etc) May take Delsym Cough Suppressant at bedtime for nighttime cough.

## 2019-05-20 NOTE — ED Provider Notes (Signed)
[facility]    CSN: [id number] Arrival date & time: 05/20/19  1318      History   Chief Complaint Chief Complaint  Patient presents with  . Sore Throat  . Nasal Congestion    HPI Anita Tanner is a 18 y.o. female.   Patient complains of onset of mild sore throat about a week ago, now improved, followed by hoarseness and increased sinus congestion.  She has now developed a mild cough.  She denies fevers, chills, and sweats, and no changes in taste/smell.  She notes that she has a history of seasonal rhinitis, and past history of frequent otitis media with placement of T-tubes.  The history is provided by the patient.    Past Medical History:  Diagnosis Date  . ADD (attention deficit disorder)   . Depression   . Fracture of wrist    left 2008, bilat 2011, right 2013    Patient Active Problem List   Diagnosis Date Noted  . Iron deficiency anemia due to chronic blood loss 12/16/2016  . MDD (major depressive disorder), recurrent severe, without psychosis (HCC) 04/10/2015  . Migraine headache 01/05/2014  . Anxiety and depression 08/09/2012  . ADHD (attention deficit hyperactivity disorder) 05/16/2012  . ALLERGIC RHINITIS 01/05/2011  . EPISTAXIS, RECURRENT 01/05/2011  . CONSTIPATION 06/30/2010    Past Surgical History:  Procedure Laterality Date  . TYMPANOSTOMY TUBE PLACEMENT  2002    OB History   No obstetric history on file.      Home Medications    Prior to Admission medications   Medication Sig Start Date End Date Taking? Authorizing Provider  amoxicillin (AMOXIL) 875 MG tablet Take 1 tablet (875 mg total) by mouth 2 (two) times daily. 05/20/19   Lattie HawBeese, Stephen A, MD  amphetamine-dextroamphetamine (ADDERALL XR) 15 MG 24 hr capsule TAKE 1 CAPSULE BY MOUTH EVERY MORNING. 01/26/19   Agapito GamesMetheney, Arlee D, MD  buPROPion (WELLBUTRIN XL) 150 MG 24 hr tablet Take 1 tablet (150 mg total) by mouth every morning. 03/22/19 03/21/20  Agapito GamesMetheney, Talicia D,  MD  clindamycin-benzoyl peroxide (BENZACLIN) gel Apply topically 2 (two) times daily. 01/26/19   Agapito GamesMetheney, Anett D, MD  ipratropium (ATROVENT) 0.03 % nasal spray Place 2 sprays into both nostrils 3 (three) times daily as needed for rhinitis. 05/16/19   Michela PitcherFawze, Mina A, PA-C  Multiple Vitamin (MULTIVITAMIN WITH MINERALS) TABS tablet Take 1 tablet by mouth daily.    [provider]  phenol (CHLORASEPTIC) 1.4 % LIQD Use as directed 1 spray in the mouth or throat as needed for throat irritation / pain. 05/16/19   Luevenia MaxinFawze, Mina A, PA-C  predniSONE (DELTASONE) 20 MG tablet Take one tab by mouth twice daily for 4 days, then one daily for 3 days. Take with food. 05/20/19   Lattie HawBeese, Stephen A, MD  Vilazodone HCl (VIIBRYD) 20 MG TABS TAKE 1 TABLET (20 MG TOTAL) BY MOUTH DAILY. 01/26/19   Agapito GamesMetheney, Vennesa D, MD    Family History Family History  Problem Relation Age of Onset  . Coronary artery disease Father   . ADD / ADHD Father     Social History Social History   Tobacco Use  . Smoking status: Never Smoker  . Smokeless tobacco: Never Used  Substance Use Topics  . Alcohol use: No  . Drug use: No     Allergies   Prozac [fluoxetine hcl]   Review of Systems Review of Systems + sore throat, improved + hoarse + cough No pleuritic pain No  wheezing + nasal congestion + post-nasal drainage No sinus pain/pressure No itchy/red eyes No earache No hemoptysis No SOB No fever/chills No nausea No vomiting No abdominal pain No diarrhea No urinary symptoms No skin rash + fatigue No myalgias No headache    Physical Exam Triage Vital Signs ED Triage Vitals  Enc Vitals Group     BP 05/20/19 1410 134/87     Pulse Rate 05/20/19 1410 87     Resp 05/20/19 1410 16     Temp 05/20/19 1410 98.6 F (37 C)     Temp Source 05/20/19 1410 Oral     SpO2 05/20/19 1410 98 %     Weight 05/20/19 1411 184 lb (83.5 kg)     Height 05/20/19 1411 5\' 3"  (1.6 m)     Head Circumference --      Peak  Flow --      Pain Score 05/20/19 1411 2     Pain Loc --      Pain Edu? --      Excl. in [place]? --    No data found.  Updated Vital Signs BP 134/87 (BP Location: Right Arm)   Pulse 87   Temp 98.6 F (37 C) (Oral)   Resp 16   Ht 5\' 3"  (1.6 m)   Wt 83.5 kg   LMP 05/05/2019   SpO2 98%   BMI 32.59 kg/m   Visual Acuity Right Eye Distance:   Left Eye Distance:   Bilateral Distance:    Right Eye Near:   Left Eye Near:    Bilateral Near:     Physical Exam Nursing notes and Vital Signs reviewed. Appearance:  Patient appears stated age, and in no acute distress Eyes:  Pupils are equal, round, and reactive to light and accomodation.  Extraocular movement is intact.  Conjunctivae are not inflamed  Ears:  Canals normal. Right tympanic membrane scarred but otherwise normal; left tympanic membrane has serous effusion. Nose:  Congested turbinates, more prominent on the left.  No sinus tenderness.  Pharynx:  Normal Neck:  Supple.  Enlarged posterior/lateral nodes are palpated bilaterally.  Lungs:  Clear to auscultation.  Breath sounds are equal.  Moving air well. Heart:  Regular rate and rhythm without murmurs, rubs, or gallops.  Abdomen:  Nontender without masses or hepatosplenomegaly.  Bowel sounds are present.  No CVA or flank tenderness.  Extremities:  No edema.  Skin:  No rash present.    UC Treatments / Results  Labs (all labs ordered are listed, but only abnormal results are displayed) Labs Reviewed  STREP A DNA PROBE  POCT RAPID STREP A (OFFICE) negative    EKG   Radiology No results found.  Procedures Procedures (including critical care time)  Medications Ordered in UC Medications - No data to display  Initial Impression / Assessment and Plan / UC Course  I have reviewed the triage vital signs and the nursing notes.  Pertinent labs & imaging results that were available during my care of the patient were reviewed by me and considered in my medical decision making  (see chart for details).    Begin amoxicillin, and prednisone burst/taper. Followup with Family Doctor if not improved in 10 days.   Final Clinical Impressions(s) / UC Diagnoses   Final diagnoses:  Viral URI with cough  Acute serous otitis media of left ear, recurrence not specified     Discharge Instructions     Take plain guaifenesin (1200mg  extended release tabs such as Mucinex) twice  daily, with plenty of water, for cough and congestion.  May add Pseudoephedrine (30mg , one or two every 4 to 6 hours) for sinus congestion.  Get adequate rest.   May use Afrin nasal spray (or generic oxymetazoline) each morning for about 5 days and then discontinue.  Also recommend using saline nasal spray several times daily and saline nasal irrigation (AYR is a common brand).  Use Flonase nasal spray each morning after using Afrin nasal spray and saline nasal irrigation. Try warm salt water gargles for sore throat.  Stop all antihistamines for now, and other non-prescription cough/cold preparations (Nyquil, etc) May take Delsym Cough Suppressant at bedtime for nighttime cough.       ED Prescriptions    Medication Sig Dispense Auth. Provider   amoxicillin (AMOXIL) 875 MG tablet Take 1 tablet (875 mg total) by mouth 2 (two) times daily. 20 tablet , MD   predniSONE (DELTASONE) 20 MG tablet Take one tab by mouth twice daily for 4 days, then one daily for 3 days. Take with food. 11 tablet Lattie Haw, MD        Lattie Haw, MD 05/25/19 [phone]

## 2019-05-20 NOTE — ED Triage Notes (Signed)
Patient reports 6 days of congestion with intermittent sore throat; denies fever; has seasonal allergies but not taking any regular meds; no OTCs past couple of days.

## 2019-05-21 ENCOUNTER — Other Ambulatory Visit: Payer: Self-pay | Admitting: Family Medicine

## 2019-05-21 LAB — STREP A DNA PROBE: Group A Strep Probe: NOT DETECTED

## 2019-05-22 ENCOUNTER — Other Ambulatory Visit: Payer: Self-pay | Admitting: Family Medicine

## 2019-05-22 MED ORDER — AMPHETAMINE-DEXTROAMPHET ER 15 MG PO CP24: ORAL_CAPSULE | ORAL | 0 refills | Status: DC

## 2019-05-22 MED FILL — ADDERALL XR 15 MG CAP SA: 15 | 90 days supply | Qty: 90 | Fill #0

## 2019-05-22 NOTE — Telephone Encounter (Signed)
Last RX written 01/26/19 for #90 day supply  Last OV 01/26/19, pt was advised to follow up on 05/28/19  No future appt scheduled

## 2019-06-12 ENCOUNTER — Encounter: Payer: Self-pay | Admitting: Family Medicine

## 2019-06-13 ENCOUNTER — Encounter: Payer: Self-pay | Admitting: Family Medicine

## 2019-06-13 ENCOUNTER — Ambulatory Visit (INDEPENDENT_AMBULATORY_CARE_PROVIDER_SITE_OTHER): Payer: 59 | Admitting: Family Medicine

## 2019-06-13 ENCOUNTER — Other Ambulatory Visit: Payer: Self-pay

## 2019-06-13 VITALS — BP 116/62 | HR 82 | Ht 62.0 in | Wt 180.0 lb

## 2019-06-13 DIAGNOSIS — N760 Acute vaginitis: Secondary | ICD-10-CM | POA: Diagnosis not present

## 2019-06-13 DIAGNOSIS — Z23 Encounter for immunization: Secondary | ICD-10-CM

## 2019-06-13 DIAGNOSIS — N898 Other specified noninflammatory disorders of vagina: Secondary | ICD-10-CM | POA: Diagnosis not present

## 2019-06-13 DIAGNOSIS — R300 Dysuria: Secondary | ICD-10-CM | POA: Diagnosis not present

## 2019-06-13 LAB — POCT URINALYSIS DIPSTICK
Bilirubin, UA: NEGATIVE
Blood, UA: NEGATIVE
Glucose, UA: NEGATIVE
Nitrite, UA: NEGATIVE
Protein, UA: NEGATIVE
Spec Grav, UA: 1.025 (ref 1.010–1.025)
Urobilinogen, UA: 1 E.U./dL
pH, UA: 7 (ref 5.0–8.0)

## 2019-06-13 LAB — WET PREP FOR TRICH, YEAST, CLUE
MICRO NUMBER:: 1009020
Specimen Quality: ADEQUATE

## 2019-06-13 NOTE — Progress Notes (Signed)
Acute Office Visit  Subjective:    Patient ID: Anita Tanner, female    DOB: 12/30/2000, 18 y.o.   MRN: [id number]  Chief Complaint  Patient presents with  . Vaginal Itching  . Vaginal Discharge    HPI Patient is in today for "possible yeast infection".  Started having some vaginal irritation about a week and a half ago and then last week she actually started her. She c/o of vaginal discharge just a little bit of itchy irritation but not intense.  She has not tried any over-the-counter medications..  She was on amoxicillin about 3 weeks ago for her throat.   He says about 2 days ago she had some pretty intense abdominal cramps.  She says she does not usually get those while she is having her.  Even though her.  Did just finish a couple days ago.  She declines to be tested for any STDs.  She says the cramping has gradually gotten better on its own.  No fevers, chills or sweats.  She denies any dysuria or hematuria.  Past Medical History:  Diagnosis Date  . ADD (attention deficit disorder)   . Depression   . Fracture of wrist    left 2008, bilat 2011, right 2013    Past Surgical History:  Procedure Laterality Date  . TYMPANOSTOMY TUBE PLACEMENT  2002    Family History  Problem Relation Age of Onset  . Coronary artery disease Father   . ADD / ADHD Father     Social History   Socioeconomic History  . Marital status: Single    Spouse name: Not on file  . Number of children: Not on file  . Years of education: Not on file  . Highest education level: Not on file  Occupational History  . Not on file  Social Needs  . Financial resource strain: Not on file  . Food insecurity    Worry: Not on file    Inability: Not on file  . Transportation needs    Medical: Not on file    Non-medical: Not on file  Tobacco Use  . Smoking status: Never Smoker  . Smokeless tobacco: Never Used  Substance and Sexual Activity  . Alcohol use: No  . Drug use: No  . Sexual activity:  Yes    Birth control/protection: Pill  Lifestyle  . Physical activity    Days per week: Not on file    Minutes per session: Not on file  . Stress: Not on file  Relationships  . Social Musician on phone: Not on file    Gets together: Not on file    Attends religious service: Not on file    Active member of club or organization: Not on file    Attends meetings of clubs or organizations: Not on file    Relationship status: Not on file  . Intimate partner violence    Fear of current or ex partner: Not on file    Emotionally abused: Not on file    Physically abused: Not on file    Forced sexual activity: Not on file  Other Topics Concern  . Not on file  Social History Narrative   No sexually active.  In new school.      Outpatient Medications Prior to Visit  Medication Sig Dispense Refill  . amphetamine-dextroamphetamine (ADDERALL XR) 15 MG 24 hr capsule TAKE 1 CAPSULE BY MOUTH EVERY MORNING. 90 capsule 0  . amphetamine-dextroamphetamine (ADDERALL XR) 15  MG 24 hr capsule TAKE 1 CAPSULE BY MOUTH EVERY MORNING. 90 capsule 0  . buPROPion (WELLBUTRIN XL) 150 MG 24 hr tablet Take 1 tablet (150 mg total) by mouth every morning. 90 tablet 0  . clindamycin-benzoyl peroxide (BENZACLIN) gel Apply topically 2 (two) times daily. 35 g PRN  . ipratropium (ATROVENT) 0.03 % nasal spray Place 2 sprays into both nostrils 3 (three) times daily as needed for rhinitis. 30 mL 12  . Multiple Vitamin (MULTIVITAMIN WITH MINERALS) TABS tablet Take 1 tablet by mouth daily.    . Vilazodone HCl (VIIBRYD) 20 MG TABS TAKE 1 TABLET (20 MG TOTAL) BY MOUTH DAILY. 90 tablet 1  . amoxicillin (AMOXIL) 875 MG tablet Take 1 tablet (875 mg total) by mouth 2 (two) times daily. 20 tablet 0  . phenol (CHLORASEPTIC) 1.4 % LIQD Use as directed 1 spray in the mouth or throat as needed for throat irritation / pain. 29 mL 0  . predniSONE (DELTASONE) 20 MG tablet Take one tab by mouth twice daily for 4 days, then one  daily for 3 days. Take with food. 11 tablet 0   No facility-administered medications prior to visit.     Allergies  Allergen Reactions  . Prozac [Fluoxetine Hcl] Other (See Comments)    Weight gain    ROS     Objective:    Physical Exam  Constitutional: She is oriented to person, place, and time. She appears well-developed and well-nourished.  HENT:  Head: Normocephalic and atraumatic.  Eyes: Conjunctivae and EOM are normal.  Cardiovascular: Normal rate.  Pulmonary/Chest: Effort normal.  Neurological: She is alert and oriented to person, place, and time.  Skin: Skin is dry. No pallor.  Psychiatric: She has a normal mood and affect. Her behavior is normal.  Vitals reviewed.   BP 116/62   Pulse 82   Ht 5\' 2"  (1.575 m)   Wt 180 lb (81.6 kg)   SpO2 98%   BMI 32.92 kg/m  Wt Readings from Last 3 Encounters:  06/13/19 180 lb (81.6 kg)  05/20/19 184 lb (83.5 kg)  09/27/18 183 lb (83 kg) (95 %, Z= 1.65)*   * Growth percentiles are based on CDC (Girls, 2-20 Years) data.    Health Maintenance Due  Topic Date Due  . INFLUENZA VACCINE  03/25/2019    There are no preventive care reminders to display for this patient.   Lab Results  Component Value Date   TSH 1.278 09/18/2015   Lab Results  Component Value Date   WBC 6.7 12/30/2017   HGB 15.0 12/30/2017   HCT 42.9 12/30/2017   MCV 89.6 12/30/2017   PLT 314 12/30/2017   Lab Results  Component Value Date   NA 143 04/09/2015   K 4.3 04/09/2015   CO2 26 04/09/2015   GLUCOSE 101 (H) 04/09/2015   BUN 7 04/09/2015   CREATININE 0.70 04/09/2015   BILITOT 0.2 (L) 04/09/2015   ALKPHOS 98 04/09/2015   AST 20 04/09/2015   ALT 19 04/09/2015   PROT 6.6 04/09/2015   ALBUMIN 3.7 04/09/2015   CALCIUM 8.9 04/09/2015   ANIONGAP 10 04/09/2015   No results found for: CHOL No results found for: HDL No results found for: LDLCALC No results found for: TRIG No results found for: CHOLHDL Lab Results  Component Value Date    HGBA1C 5.0 05/28/2016       Assessment & Plan:   Problem List Items Addressed This Visit    None  Visit Diagnoses    Vaginosis    -  Primary   Relevant Orders   POCT urinalysis dipstick (Completed)   WET PREP FOR TRICH, YEAST, CLUE   Vaginal itching       Relevant Orders   POCT urinalysis dipstick (Completed)   WET PREP FOR [place], YEAST, CLUE   Burning with urination       Relevant Orders   POCT urinalysis dipstick (Completed)   WET PREP FOR [place], YEAST, CLUE   Urine Culture   Need for immunization against influenza       Relevant Orders   Flu Vaccine QUAD 36+ mos IM (Completed)     Vaginitis-wet prep sent to the lab.  Evaluate for yeast or possible BV.  Will call with results once available.  Make sure hydrating well.  Urinalysis did show some moderate leuks so we will send for culture for confirmation.  She is not having any dysuria today  Flu vaccine given today.   No orders of the defined types were placed in this encounter.    Beatrice Lecher, MD

## 2019-06-14 MED ORDER — FLUCONAZOLE 150 MG PO TABS: 150.0000 mg | ORAL_TABLET | Freq: Once | ORAL | 0 refills | Status: DC

## 2019-06-14 NOTE — Addendum Note (Signed)
Addended by: Beatrice Lecher D on: 06/14/2019 07:43 AM   Modules accepted: Orders

## 2019-06-15 LAB — URINE CULTURE
MICRO NUMBER:: 1010021
SPECIMEN QUALITY:: ADEQUATE

## 2019-06-26 ENCOUNTER — Other Ambulatory Visit: Payer: Self-pay | Admitting: *Deleted

## 2019-06-26 MED ORDER — BUPROPION HCL ER (XL) 150 MG PO TB24: 150.0000 mg | ORAL_TABLET | ORAL | 0 refills | Status: DC

## 2019-06-26 MED FILL — buPROPion HCL ER (XL) 150 M: 150 | 90 days supply | Qty: 90 | Fill #0

## 2019-07-03 ENCOUNTER — Other Ambulatory Visit: Payer: Self-pay | Admitting: *Deleted

## 2019-07-03 MED ORDER — FLUCONAZOLE 150 MG PO TABS: 150.0000 mg | ORAL_TABLET | Freq: Once | ORAL | 0 refills | Status: AC

## 2019-07-03 MED FILL — FLUCONAZOLE 150 MG TABS: 150 | 1 days supply | Qty: 1 | Fill #0

## 2019-07-05 ENCOUNTER — Encounter: Payer: Self-pay | Admitting: Family Medicine

## 2019-07-19 MED FILL — VIIBRYD 20 MG TABLET: 20 | 90 days supply | Qty: 90 | Fill #1

## 2019-07-25 DIAGNOSIS — H5203 Hypermetropia, bilateral: Secondary | ICD-10-CM | POA: Diagnosis not present

## 2019-08-07 ENCOUNTER — Encounter: Payer: Self-pay | Admitting: Family Medicine

## 2019-08-07 ENCOUNTER — Ambulatory Visit (INDEPENDENT_AMBULATORY_CARE_PROVIDER_SITE_OTHER): Payer: 59 | Admitting: Family Medicine

## 2019-08-07 DIAGNOSIS — F909 Attention-deficit hyperactivity disorder, unspecified type: Secondary | ICD-10-CM

## 2019-08-07 DIAGNOSIS — F332 Major depressive disorder, recurrent severe without psychotic features: Secondary | ICD-10-CM | POA: Diagnosis not present

## 2019-08-07 DIAGNOSIS — R635 Abnormal weight gain: Secondary | ICD-10-CM | POA: Diagnosis not present

## 2019-08-07 DIAGNOSIS — Z6832 Body mass index (BMI) 32.0-32.9, adult: Secondary | ICD-10-CM | POA: Diagnosis not present

## 2019-08-07 MED ORDER — VIIBRYD 40 MG PO TABS: 40.0000 mg | ORAL_TABLET | Freq: Every day | ORAL | 0 refills | Status: DC

## 2019-08-07 MED ORDER — AMPHETAMINE-DEXTROAMPHET ER 20 MG PO CP24: 20.0000 mg | ORAL_CAPSULE | ORAL | 0 refills | Status: DC

## 2019-08-07 MED FILL — ADDERALL XR 20 MG CAP SA: 20 | 90 days supply | Qty: 90 | Fill #0

## 2019-08-07 NOTE — Assessment & Plan Note (Addendum)
Discussed options.  We will try increasing her Adderall to 20 mg.  Plan to follow-up in 2 months.  If that is not helpful then we can also consider a short acting dose in the afternoons.

## 2019-08-07 NOTE — Assessment & Plan Note (Signed)
Discussed options.  She is not currently working with a therapist/counselor.  But she says she will look into it she does think that would be helpful.  We will also increase her Viibryd to 40 mg daily and plan to follow-up in 2 months.

## 2019-08-07 NOTE — Progress Notes (Signed)
Established Patient Office Visit  Subjective:  Patient ID: Anita Tanner, female    DOB: 03-17-2001  Age: 18 y.o. MRN: [id number]  CC:  Chief Complaint  Patient presents with  . Depression    increase in depression during this semester, requesting increase in Viibryd dosage  . ADHD    increase in difficulty concentrating this semester    HPI ASHAUNTI TREPTOW presents for   ADHD - Reports symptoms are well controlled on current regime. Denies any problems with insomnia, chest pain, palpitations, or SOB.  She just feels like she has not been able to focus quite as well.  She feels like it is not lasting sometimes as long as she would like.  Says her grades have suffered some this last semester.  F/U MDD - increase in depression during this semester, requesting increase in Viibryd dosage.  She just really struggled a little bit more than usual she is not currently working with a therapist but has in the past.  Abnormal weight gain-she really like to discuss a strategy for weight loss.  We discussed possible referral to healthy weight and wellness the last time before Covid hit.  At this point she is interested.  She says she is even noted some snoring.  Her boyfriend says that it is gotten a little louder and she thinks it is because of the weight gain   Past Medical History:  Diagnosis Date  . ADD (attention deficit disorder)   . Depression   . Fracture of wrist    left 2008, bilat 2011, right 2013    Past Surgical History:  Procedure Laterality Date  . TYMPANOSTOMY TUBE PLACEMENT  2002    Family History  Problem Relation Age of Onset  . Coronary artery disease Father   . ADD / ADHD Father     Social History   Socioeconomic History  . Marital status: Single    Spouse name: Not on file  . Number of children: Not on file  . Years of education: Not on file  . Highest education level: Not on file  Occupational History  . Not on file  Tobacco Use  . Smoking  status: Never Smoker  . Smokeless tobacco: Never Used  Substance and Sexual Activity  . Alcohol use: No  . Drug use: No  . Sexual activity: Yes    Birth control/protection: Pill  Other Topics Concern  . Not on file  Social History Narrative   No sexually active.  In new school.     Social Determinants of Health   Financial Resource Strain:   . Difficulty of Paying Living Expenses: Not on file  Food Insecurity:   . Worried About Programme researcher, broadcasting/film/video in the Last Year: Not on file  . Ran Out of Food in the Last Year: Not on file  Transportation Needs:   . Lack of Transportation (Medical): Not on file  . Lack of Transportation (Non-Medical): Not on file  Physical Activity:   . Days of Exercise per Week: Not on file  . Minutes of Exercise per Session: Not on file  Stress:   . Feeling of Stress : Not on file  Social Connections:   . Frequency of Communication with Friends and Family: Not on file  . Frequency of Social Gatherings with Friends and Family: Not on file  . Attends Religious Services: Not on file  . Active Member of Clubs or Organizations: Not on file  . Attends Banker  Meetings: Not on file  . Marital Status: Not on file  Intimate Partner Violence:   . Fear of Current or Ex-Partner: Not on file  . Emotionally Abused: Not on file  . Physically Abused: Not on file  . Sexually Abused: Not on file    Outpatient Medications Prior to Visit  Medication Sig Dispense Refill  . amphetamine-dextroamphetamine (ADDERALL XR) 15 MG 24 hr capsule TAKE 1 CAPSULE BY MOUTH EVERY MORNING. 90 capsule 0  . buPROPion (WELLBUTRIN XL) 150 MG 24 hr tablet Take 1 tablet (150 mg total) by mouth every morning. 90 tablet 0  . ipratropium (ATROVENT) 0.03 % nasal spray Place 2 sprays into both nostrils 3 (three) times daily as needed for rhinitis. 30 mL 12  . Multiple Vitamin (MULTIVITAMIN WITH MINERALS) TABS tablet Take 1 tablet by mouth daily.    Marland Kitchen. amphetamine-dextroamphetamine  (ADDERALL XR) 15 MG 24 hr capsule TAKE 1 CAPSULE BY MOUTH EVERY MORNING. 90 capsule 0  . Vilazodone HCl (VIIBRYD) 20 MG TABS TAKE 1 TABLET (20 MG TOTAL) BY MOUTH DAILY. 90 tablet 1  . clindamycin-benzoyl peroxide (BENZACLIN) gel Apply topically 2 (two) times daily. (Patient not taking: Reported on 08/07/2019) 35 g PRN   No facility-administered medications prior to visit.    Allergies  Allergen Reactions  . Prozac [Fluoxetine Hcl] Other (See Comments)    Weight gain    ROS Review of Systems    Objective:    Physical Exam  Constitutional: She is oriented to person, place, and time. She appears well-developed and well-nourished.  HENT:  Head: Normocephalic and atraumatic.  Eyes: Conjunctivae and EOM are normal.  Pulmonary/Chest: Effort normal.  Neurological: She is alert and oriented to person, place, and time.  Skin: Skin is dry. No pallor.  Psychiatric: She has a normal mood and affect. Her behavior is normal.  Vitals reviewed.   LMP 07/20/2019  Wt Readings from Last 3 Encounters:  06/13/19 180 lb (81.6 kg)  05/20/19 184 lb (83.5 kg)  09/27/18 183 lb (83 kg) (95 %, Z= 1.65)*   * Growth percentiles are based on CDC (Girls, 2-20 Years) data.     There are no preventive care reminders to display for this patient.  There are no preventive care reminders to display for this patient.  Lab Results  Component Value Date   TSH 1.278 09/18/2015   Lab Results  Component Value Date   WBC 6.7 12/30/2017   HGB 15.0 12/30/2017   HCT 42.9 12/30/2017   MCV 89.6 12/30/2017   PLT 314 12/30/2017   Lab Results  Component Value Date   NA 143 04/09/2015   K 4.3 04/09/2015   CO2 26 04/09/2015   GLUCOSE 101 (H) 04/09/2015   BUN 7 04/09/2015   CREATININE 0.70 04/09/2015   BILITOT 0.2 (L) 04/09/2015   ALKPHOS 98 04/09/2015   AST 20 04/09/2015   ALT 19 04/09/2015   PROT 6.6 04/09/2015   ALBUMIN 3.7 04/09/2015   CALCIUM 8.9 04/09/2015   ANIONGAP 10 04/09/2015   No results  found for: CHOL No results found for: HDL No results found for: LDLCALC No results found for: TRIG No results found for: CHOLHDL Lab Results  Component Value Date   HGBA1C 5.0 05/28/2016      Assessment & Plan:   Problem List Items Addressed This Visit      Other   MDD (major depressive disorder), recurrent severe, without psychosis (HCC) - Primary (Chronic)    Discussed options.  She is  not currently working with a therapist/counselor.  But she says she will look into it she does think that would be helpful.  We will also increase her Viibryd to 40 mg daily and plan to follow-up in 2 months.      Relevant Medications   Vilazodone HCl (VIIBRYD) 40 MG TABS   ADHD (attention deficit hyperactivity disorder)    Discussed options.  We will try increasing her Adderall to 20 mg.  Plan to follow-up in 2 months.  If that is not helpful then we can also consider a short acting dose in the afternoons.      Abnormal weight gain    We will go out and place referral to healthy weight and wellness.  I do think her snoring would likely improve if she would able to lose weight but if it persists I did recommend that we get a sleep study.       Other Visit Diagnoses    BMI 32.0-32.9,adult       Relevant Orders   Amb Ref to Medical Weight Management      Meds ordered this encounter  Medications  . Vilazodone HCl (VIIBRYD) 40 MG TABS    Sig: Take 1 tablet (40 mg total) by mouth daily.    Dispense:  1 tablet    Refill:  0  . amphetamine-dextroamphetamine (ADDERALL XR) 20 MG 24 hr capsule    Sig: Take 1 capsule (20 mg total) by mouth every morning. TAKE 1 CAPSULE BY MOUTH EVERY MORNING.    Dispense:  90 capsule    Refill:  0    Follow-up: Return in about 2 months (around 10/08/2019) for ADHD and Mood.    Beatrice Lecher, MD

## 2019-08-07 NOTE — Assessment & Plan Note (Signed)
We will go out and place referral to healthy weight and wellness.  I do think her snoring would likely improve if she would able to lose weight but if it persists I did recommend that we get a sleep study.

## 2019-09-01 ENCOUNTER — Other Ambulatory Visit: Payer: Self-pay | Admitting: *Deleted

## 2019-09-01 ENCOUNTER — Encounter: Payer: Self-pay | Admitting: Family Medicine

## 2019-09-01 MED ORDER — VIIBRYD 40 MG PO TABS: 40.0000 mg | ORAL_TABLET | Freq: Every day | ORAL | 1 refills | Status: DC

## 2019-09-05 ENCOUNTER — Ambulatory Visit: Payer: 59 | Attending: Internal Medicine

## 2019-09-05 DIAGNOSIS — Z20822 Contact with and (suspected) exposure to covid-19: Secondary | ICD-10-CM

## 2019-09-06 ENCOUNTER — Other Ambulatory Visit: Payer: Self-pay

## 2019-09-06 LAB — NOVEL CORONAVIRUS, NAA: SARS-CoV-2, NAA: DETECTED — AB

## 2019-09-06 MED ORDER — VIIBRYD 40 MG PO TABS: 40.0000 mg | ORAL_TABLET | Freq: Every day | ORAL | 1 refills | Status: DC

## 2019-09-06 MED FILL — VIIBRYD 40 MG TABLET: 40 | 30 days supply | Qty: 30 | Fill #0

## 2019-09-18 ENCOUNTER — Other Ambulatory Visit: Payer: Self-pay

## 2019-09-18 ENCOUNTER — Ambulatory Visit (INDEPENDENT_AMBULATORY_CARE_PROVIDER_SITE_OTHER): Payer: 59 | Admitting: Family Medicine

## 2019-09-18 ENCOUNTER — Encounter (INDEPENDENT_AMBULATORY_CARE_PROVIDER_SITE_OTHER): Payer: Self-pay | Admitting: Family Medicine

## 2019-09-18 VITALS — BP 118/74 | HR 110 | Temp 97.5°F | Ht 63.0 in | Wt 180.0 lb

## 2019-09-18 DIAGNOSIS — E669 Obesity, unspecified: Secondary | ICD-10-CM | POA: Diagnosis not present

## 2019-09-18 DIAGNOSIS — R0602 Shortness of breath: Secondary | ICD-10-CM

## 2019-09-18 DIAGNOSIS — E559 Vitamin D deficiency, unspecified: Secondary | ICD-10-CM | POA: Diagnosis not present

## 2019-09-18 DIAGNOSIS — R5383 Other fatigue: Secondary | ICD-10-CM

## 2019-09-18 DIAGNOSIS — F418 Other specified anxiety disorders: Secondary | ICD-10-CM | POA: Diagnosis not present

## 2019-09-18 DIAGNOSIS — Z9189 Other specified personal risk factors, not elsewhere classified: Secondary | ICD-10-CM

## 2019-09-18 DIAGNOSIS — Z0289 Encounter for other administrative examinations: Secondary | ICD-10-CM

## 2019-09-18 DIAGNOSIS — Z6832 Body mass index (BMI) 32.0-32.9, adult: Secondary | ICD-10-CM

## 2019-09-19 ENCOUNTER — Other Ambulatory Visit: Payer: Self-pay | Admitting: Family Medicine

## 2019-09-19 LAB — COMPREHENSIVE METABOLIC PANEL
ALT: 25 IU/L (ref 0–32)
AST: 21 IU/L (ref 0–40)
Albumin/Globulin Ratio: 2.1 (ref 1.2–2.2)
Albumin: 4.6 g/dL (ref 3.9–5.0)
Alkaline Phosphatase: 111 IU/L (ref 39–117)
BUN/Creatinine Ratio: 13 (ref 9–23)
BUN: 8 mg/dL (ref 6–20)
Bilirubin Total: 0.6 mg/dL (ref 0.0–1.2)
CO2: 23 mmol/L (ref 20–29)
Calcium: 9.4 mg/dL (ref 8.7–10.2)
Chloride: 102 mmol/L (ref 96–106)
Creatinine, Ser: 0.62 mg/dL (ref 0.57–1.00)
GFR calc Af Amer: 150 mL/min/{1.73_m2} (ref >59)
GFR calc non Af Amer: 130 mL/min/{1.73_m2} (ref >59)
Globulin, Total: 2.2 g/dL (ref 1.5–4.5)
Glucose: 81 mg/dL (ref 65–99)
Potassium: 4.2 mmol/L (ref 3.5–5.2)
Sodium: 141 mmol/L (ref 134–144)
Total Protein: 6.8 g/dL (ref 6.0–8.5)

## 2019-09-19 LAB — CBC WITH DIFFERENTIAL/PLATELET
Basophils Absolute: 0 10*3/uL (ref 0.0–0.2)
Basos: 0 %
EOS (ABSOLUTE): 0.1 10*3/uL (ref 0.0–0.4)
Eos: 1 %
Hematocrit: 46.1 % (ref 34.0–46.6)
Hemoglobin: 15.7 g/dL (ref 11.1–15.9)
Immature Grans (Abs): 0 10*3/uL (ref 0.0–0.1)
Immature Granulocytes: 0 %
Lymphocytes Absolute: 1.9 10*3/uL (ref 0.7–3.1)
Lymphs: 34 %
MCH: 31.3 pg (ref 26.6–33.0)
MCHC: 34.1 g/dL (ref 31.5–35.7)
MCV: 92 fL (ref 79–97)
Monocytes Absolute: 0.4 10*3/uL (ref 0.1–0.9)
Monocytes: 8 %
Neutrophils Absolute: 3.2 10*3/uL (ref 1.4–7.0)
Neutrophils: 57 %
Platelets: 354 10*3/uL (ref 150–450)
RBC: 5.02 x10E6/uL (ref 3.77–5.28)
RDW: 12.1 % (ref 11.7–15.4)
WBC: 5.6 10*3/uL (ref 3.4–10.8)

## 2019-09-19 LAB — HEMOGLOBIN A1C
Est. average glucose Bld gHb Est-mCnc: 97 mg/dL
Hgb A1c MFr Bld: 5 % (ref 4.8–5.6)

## 2019-09-19 LAB — LIPID PANEL WITH LDL/HDL RATIO
Cholesterol, Total: 233 mg/dL — ABNORMAL HIGH (ref 100–199)
HDL: 49 mg/dL (ref >39)
LDL Chol Calc (NIH): 163 mg/dL — ABNORMAL HIGH (ref 0–99)
LDL/HDL Ratio: 3.3 ratio — ABNORMAL HIGH (ref 0.0–3.2)
Triglycerides: 118 mg/dL (ref 0–149)
VLDL Cholesterol Cal: 21 mg/dL (ref 5–40)

## 2019-09-19 LAB — VITAMIN B12: Vitamin B-12: 804 pg/mL (ref 232–1245)

## 2019-09-19 LAB — T4, FREE: Free T4: 0.9 ng/dL (ref 0.82–1.77)

## 2019-09-19 LAB — T3: T3, Total: 132 ng/dL (ref 71–180)

## 2019-09-19 LAB — INSULIN, RANDOM: INSULIN: 13.3 u[IU]/mL (ref 2.6–24.9)

## 2019-09-19 LAB — FOLATE: Folate: >20 ng/mL (ref >3.0)

## 2019-09-19 LAB — VITAMIN D 25 HYDROXY (VIT D DEFICIENCY, FRACTURES): Vit D, 25-Hydroxy: 31.7 ng/mL (ref 30.0–100.0)

## 2019-09-19 LAB — TSH: TSH: 1.89 u[IU]/mL (ref 0.450–4.500)

## 2019-09-19 NOTE — Progress Notes (Signed)
Dear Dr. Linford Arnold,   Thank you for referring Anita Tanner to our clinic. The following note includes my evaluation and treatment recommendations.  Chief Complaint:   OBESITY [name] (MR# [id number]) is a 19 y.o. female who presents for evaluation and treatment of obesity and related comorbidities. Anita was referred to our clinic by Barbarann Ehlers. Metheney, MD. Current BMI is Body mass index is 31.89 kg/m.[name] has been struggling with her weight for many years and has been unsuccessful in either losing weight, maintaining weight loss, or reaching her healthy weight goal. Recent COVID (positive). Anita doesn't like most vegetables. She is skipping lunch every other day. Anita has juice in the morning and BODYARMOR drinks or cran-apple juice. Anita eats approximately between noon and 1:00 PM, and she has [employer] 8 count nuggets, fries and sweet tea (feels full, and often can't finish nuggets). Anita eats at 7:00 PM and she has hibachi steak or chicken with no vegetables, (rice) 2 cups (feels full).  [name] is currently in the action stage of change and ready to dedicate time achieving and maintaining a healthier weight. [name] is interested in becoming our patient and working on intensive lifestyle modifications including (but not limited to) diet and exercise for weight loss.  Will's habits were reviewed today and are as follows: Her family eats meals together, her desired weight loss is 35 pounds, she started gaining weight in high school, her heaviest weight ever was 187 pounds, she is a picky eater and doesn't like to eat healthier foods, she has significant food cravings issues, she skips meals frequently, she is frequently drinking liquids with calories, she frequently makes poor food choices, she frequently eats larger portions than normal, she has binge eating behaviors and she struggles with emotional eating.  Depression Screen [name]'s Food and Mood  (modified PHQ-9) score was mildly positive.  Depression screen [facility] 2/9 09/18/2019  Decreased Interest 0  Down, Depressed, Hopeless 1  PHQ - 2 Score 1  Altered sleeping 1  Tired, decreased energy 2  Change in appetite 2  Feeling bad or failure about yourself  1  Trouble concentrating 2  Moving slowly or fidgety/restless 0  Suicidal thoughts 0  PHQ-9 Score 9  Difficult doing work/chores Somewhat difficult  Some recent data might be hidden   Subjective:   Other fatigue (new) - Plan: EKG 12-Lead, Vitamin B12, CBC with Differential/Platelet, Comprehensive metabolic panel, Folate, Hemoglobin A1c, Insulin, random, T3, T4, free, TSH Anita Tanner admits to daytime somnolence and admits to waking up still tired. Patent has a history of symptoms of daytime fatigue and morning fatigue. Anita Tanner generally gets 6 hours of sleep per night, and states that she has generally restful sleep. Snoring is present. Apneic episodes are not present. Epworth Sleepiness Score is 3. EKG ordered today shows sinus tachycardia.  SOB (shortness of breath) on exertion (new) - Plan: Lipid Panel With LDL/HDL Ratio Stormy notes increasing shortness of breath with exercising and seems to be worsening over time with weight gain. She notes getting out of breath sooner with activity than she used to. This has not gotten worse recently. Ameshia denies shortness of breath at rest or orthopnea. EKG ordered today shows sinus tachycardia.  Vitamin D deficiency - Plan: VITAMIN D 25 Hydroxy (Vit-D Deficiency, Fractures) Christene has a likely diagnosis of vitamin D deficiency, given obesity. She is not currently taking vit D.   Depression with anxiety Anita Tanner shows no sign of suicidal or homicidal ideations. Her symptoms are  somewhat controlled. She is managed by Dr. Linford Arnold.  At risk for osteoporosis Anita Tanner is at higher risk of osteopenia and osteoporosis due to Vitamin D deficiency.   Assessment/Plan:   Other fatigue  (new) - Plan: EKG 12-Lead, Vitamin B12, CBC with Differential/Platelet, Comprehensive metabolic panel, Folate, Hemoglobin A1c, Insulin, random, T3, T4, free, TSH Kayah does feel that her weight is causing her energy to be lower than it should be. Fatigue may be related to obesity, depression or many other causes. Labs, indirect calorimetry and EKG will be ordered, and in the meanwhile, [name] will focus on self care including making healthy food choices, increasing physical activity and focusing on stress reduction.  SOB (shortness of breath) on exertion (new) - Plan: Lipid Panel With LDL/HDL Ratio Anita Tanner does feel that she gets out of breath more easily that she used to when she exercises. Trivia's shortness of breath appears to be obesity related and exercise induced. She has agreed to work on weight loss and gradually increase exercise to treat her exercise induced shortness of breath. Will continue to monitor closely. Labs, indirect calorimetry and EKG will be ordered today.  Vitamin D deficiency - Plan: VITAMIN D 25 Hydroxy (Vit-D Deficiency, Fractures) Anita Tanner has a likely diagnosis of vitamin D deficiency, given obesity. Low Vitamin D level contributes to fatigue and are associated with obesity, breast, and colon cancer. We will check vitamin D level today.  Depression with anxiety Anita Tanner will follow up with Dr. Linford Arnold.  At risk for osteoporosis  Anita Tanner was given approximately 15 minutes of osteoporosis prevention counseling today. Anita Tanner is at risk for osteopenia and osteoporosis due to her Vitamin D deficiency. She was encouraged to take her Vitamin D and follow her higher calcium diet and increase strengthening exercise to help strengthen her bones and decrease her risk of osteopenia and osteoporosis.  Repetitive spaced learning was employed today to elicit superior memory formation and behavioral change.  Class 1 obesity with serious comorbidity and body mass  index (BMI) of 32.0 to 32.9 in adult, unspecified obesity type Anita Tanner is currently in the action stage of change and her goal is to continue with weight loss efforts. I recommend [name] begin the structured treatment plan as follows:  She has agreed to the Category 2 Plan.  Exercise goals: No exercise has been prescribed at this time.   Behavioral modification strategies: increasing lean protein intake, increasing vegetables, meal planning and cooking strategies, keeping healthy foods in the home and avoiding temptations.  She was informed of the importance of frequent follow-up visits to maximize her success with intensive lifestyle modifications for her multiple health conditions. She was informed we would discuss her lab results at her next visit unless there is a critical issue that needs to be addressed sooner. Aarya agreed to keep her next visit at the agreed upon time to discuss these results.  Objective:   Blood pressure 118/74, pulse (!) 110, temperature (!) 97.5 F (36.4 C), temperature source Oral, height 5\' 3"  (1.6 m), weight 180 lb (81.6 kg), last menstrual period 08/14/2019, SpO2 97 %. Body mass index is 31.89 kg/m.  EKG: Normal sinus rhythm, rate 104 BPM.  Indirect Calorimeter completed today shows a VO2 of 202 and a REE of 1407.  Her calculated basal metabolic rate is 08/16/2019 thus her basal metabolic rate is worse than expected.  General: Cooperative, alert, well developed, in no acute distress. HEENT: Conjunctivae and lids unremarkable. Cardiovascular: Regular rhythm.  Lungs: Normal work of breathing. Neurologic: No focal  deficits.   Lab Results  Component Value Date   CREATININE 0.62 09/18/2019   BUN 8 09/18/2019   NA 141 09/18/2019   K 4.2 09/18/2019   CL 102 09/18/2019   CO2 23 09/18/2019   Lab Results  Component Value Date   ALT 25 09/18/2019   AST 21 09/18/2019   ALKPHOS 111 09/18/2019   BILITOT 0.6 09/18/2019   Lab Results  Component Value  Date   HGBA1C 5.0 09/18/2019   HGBA1C 5.0 05/28/2016   Lab Results  Component Value Date   INSULIN WILL FOLLOW 09/18/2019   Lab Results  Component Value Date   TSH 1.890 09/18/2019   Lab Results  Component Value Date   CHOL 233 (H) 09/18/2019   HDL 49 09/18/2019   LDLCALC 163 (H) 09/18/2019   TRIG 118 09/18/2019   Lab Results  Component Value Date   WBC 5.6 09/18/2019   HGB 15.7 09/18/2019   HCT 46.1 09/18/2019   MCV 92 09/18/2019   PLT 354 09/18/2019   Lab Results  Component Value Date   FERRITIN 41 12/30/2017    Attestation Statements:   Reviewed by clinician on day of visit: allergies, medications, problem list, medical history, surgical history, family history, social history, and previous encounter notes.  I, Doreene Nest, am acting as transcriptionist for Eber Jones, MD.  I have reviewed the above documentation for accuracy and completeness, and I agree with the above. Eber Jones, MD

## 2019-09-25 ENCOUNTER — Encounter: Payer: Self-pay | Admitting: Family Medicine

## 2019-09-25 ENCOUNTER — Encounter (INDEPENDENT_AMBULATORY_CARE_PROVIDER_SITE_OTHER): Payer: Self-pay | Admitting: Family Medicine

## 2019-09-25 MED ORDER — BUPROPION HCL ER (XL) 150 MG PO TB24: 150.0000 mg | ORAL_TABLET | ORAL | 1 refills | Status: DC

## 2019-09-25 MED FILL — buPROPion HCL ER (XL) 150 M: 150 | 90 days supply | Qty: 90 | Fill #0

## 2019-09-25 NOTE — Telephone Encounter (Signed)
Please advise 

## 2019-10-02 ENCOUNTER — Ambulatory Visit (INDEPENDENT_AMBULATORY_CARE_PROVIDER_SITE_OTHER): Payer: 59 | Admitting: Family Medicine

## 2019-10-02 ENCOUNTER — Encounter (INDEPENDENT_AMBULATORY_CARE_PROVIDER_SITE_OTHER): Payer: Self-pay | Admitting: Family Medicine

## 2019-10-02 ENCOUNTER — Other Ambulatory Visit: Payer: Self-pay

## 2019-10-02 VITALS — BP 109/71 | HR 101 | Temp 98.0°F | Ht 63.0 in | Wt 178.0 lb

## 2019-10-02 DIAGNOSIS — Z9189 Other specified personal risk factors, not elsewhere classified: Secondary | ICD-10-CM

## 2019-10-02 DIAGNOSIS — E8881 Metabolic syndrome: Secondary | ICD-10-CM | POA: Diagnosis not present

## 2019-10-02 DIAGNOSIS — E559 Vitamin D deficiency, unspecified: Secondary | ICD-10-CM | POA: Diagnosis not present

## 2019-10-02 DIAGNOSIS — E669 Obesity, unspecified: Secondary | ICD-10-CM

## 2019-10-02 DIAGNOSIS — E7849 Other hyperlipidemia: Secondary | ICD-10-CM | POA: Diagnosis not present

## 2019-10-02 DIAGNOSIS — Z6831 Body mass index (BMI) 31.0-31.9, adult: Secondary | ICD-10-CM | POA: Diagnosis not present

## 2019-10-02 MED ORDER — VITAMIN D (ERGOCALCIFEROL) 1.25 MG (50000 UNIT) PO CAPS: 50000.0000 [IU] | ORAL_CAPSULE | ORAL | 0 refills | Status: DC

## 2019-10-03 NOTE — Progress Notes (Signed)
Chief Complaint:   OBESITY Anita Tanner is here to discuss her progress with her obesity treatment plan along with follow-up of her obesity related diagnoses. Anita Tanner is on the Category 2 Plan and states she is following her eating plan approximately 75% of the time. Anita Tanner states she is exercising 0 minutes 0 times per week.  Today's visit was #: 2 Starting weight: 180 lbs Starting date: 09/18/2019 Today's weight: 178 lbs Today's date: 10/02/2019 Total lbs lost to date: 2 Total lbs lost since last in-office visit: 2  Interim History: Anita Tanner voices that her biggest struggle has been lunch and dinner. She only has approximately 30 minutes to eat between classes and work. She finds that the portion of food on lunch and dinner to be larger than expected. She did eat quite a bit of celery and peanut butter for snacks.  Subjective:   Other hyperlipidemia Anita Tanner has hyperlipidemia and her last LDL was 163, HDL was 49 and triglycerides were 118 (09/18/19). She is not on medications. Anita Tanner has been trying to improve her cholesterol levels with intensive lifestyle modification including a low saturated fat diet, exercise and weight loss. She denies any chest pain, claudication or myalgias.  Lab Results  Component Value Date   ALT 25 09/18/2019   AST 21 09/18/2019   ALKPHOS 111 09/18/2019   BILITOT 0.6 09/18/2019   Lab Results  Component Value Date   CHOL 233 (H) 09/18/2019   HDL 49 09/18/2019   LDLCALC 163 (H) 09/18/2019   TRIG 118 09/18/2019   Vitamin D deficiency (new) Anita Tanner has a new diagnosis of Vitamin D deficiency and her vitamin D level was 31.7 on 09/18/19. She is not on vit D supplementation. She admits fatigue.  Insulin resistance Anita Tanner has a diagnosis of insulin resistance based on her elevated fasting insulin level >5. Her last Hgb A1c was 5.0 and last insulin level was 13.3 (09/18/19). She is still experiencing some cravings for sweets. She  continues to work on diet and exercise to decrease her risk of diabetes.  Lab Results  Component Value Date   INSULIN 13.3 09/18/2019   Lab Results  Component Value Date   HGBA1C 5.0 09/18/2019   At risk for heart disease Anita Tanner is at a higher than average risk for cardiovascular disease due to obesity, hyperlipidemia and insulin resistance. Reviewed: no chest pain on exertion, no dyspnea on exertion, and no swelling of ankles.  Assessment/Plan:   Other hyperlipidemia Cardiovascular risk and specific lipid/LDL goals reviewed.  We discussed several lifestyle modifications today and Rhythm will continue to work on diet, exercise and weight loss efforts. We will repeat labs in 3 months. Orders and follow up as documented in patient record.   Counseling Intensive lifestyle modifications are the first line treatment for this issue. . Dietary changes: Increase soluble fiber. Decrease simple carbohydrates. . Exercise changes: Moderate to vigorous-intensity aerobic activity 150 minutes per week if tolerated. . Lipid-lowering medications: see documented in medical record.  Lab Results  Component Value Date   ALT 25 09/18/2019   AST 21 09/18/2019   ALKPHOS 111 09/18/2019   BILITOT 0.6 09/18/2019   Lab Results  Component Value Date   CHOL 233 (H) 09/18/2019   HDL 49 09/18/2019   LDLCALC 163 (H) 09/18/2019   TRIG 118 09/18/2019   Vitamin D deficiency (new) Low Vitamin D level contributes to fatigue and are associated with obesity, breast, and colon cancer. Anita Tanner agrees to start prescription Vitamin D @50 ,000  IU every week #4 with no refills and she will follow-up for routine testing of Vitamin D, at least 2-3 times per year to avoid over-replacement.  Insulin resistance Anita Tanner will continue to work on weight loss, exercise, and decreasing simple carbohydrates to help decrease the risk of diabetes. We will repeat labs in 3 months. Anita Tanner agreed to follow-up with [place] as  directed to closely monitor her progress.  At risk for heart disease Anita Tanner was given approximately 15 minutes of coronary artery disease prevention counseling today. She is 19 y.o. female and has risk factors for heart disease including obesity, hyperlipidemia and insulin resistance. We discussed intensive lifestyle modifications today with an emphasis on specific weight loss instructions and strategies.   Repetitive spaced learning was employed today to elicit superior memory formation and behavioral change.  Class 1 obesity with serious comorbidity and body mass index (BMI) of 31.0 to 31.9 in adult, unspecified obesity type Anita Tanner is currently in the action stage of change. As such, her goal is to continue with weight loss efforts. She has agreed to the Category 2 Plan and keeping a food journal and adhering to recommended goals of 300 to 400 calories and 25-30+ grams of protein at lunch daily.   Exercise goals: No exercise has been prescribed at this time.  Behavioral modification strategies: increasing lean protein intake, increasing vegetables, meal planning and cooking strategies, keeping healthy foods in the home and planning for success.  Anita Tanner has agreed to follow-up with our clinic in 2 weeks. She was informed of the importance of frequent follow-up visits to maximize her success with intensive lifestyle modifications for her multiple health conditions.   Objective:   Blood pressure 109/71, pulse (!) 101, temperature 98 F (36.7 C), temperature source Oral, height 5\' 3"  (1.6 m), weight 178 lb (80.7 kg), last menstrual period 09/28/2019, SpO2 95 %. Body mass index is 31.53 kg/m.  General: Cooperative, alert, well developed, in no acute distress. HEENT: Conjunctivae and lids unremarkable. Cardiovascular: Regular rhythm.  Lungs: Normal work of breathing. Neurologic: No focal deficits.   Lab Results  Component Value Date   CREATININE 0.62 09/18/2019   BUN 8 09/18/2019    NA 141 09/18/2019   K 4.2 09/18/2019   CL 102 09/18/2019   CO2 23 09/18/2019   Lab Results  Component Value Date   ALT 25 09/18/2019   AST 21 09/18/2019   ALKPHOS 111 09/18/2019   BILITOT 0.6 09/18/2019   Lab Results  Component Value Date   HGBA1C 5.0 09/18/2019   HGBA1C 5.0 05/28/2016   Lab Results  Component Value Date   INSULIN 13.3 09/18/2019   Lab Results  Component Value Date   TSH 1.890 09/18/2019   Lab Results  Component Value Date   CHOL 233 (H) 09/18/2019   HDL 49 09/18/2019   LDLCALC 163 (H) 09/18/2019   TRIG 118 09/18/2019   Lab Results  Component Value Date   WBC 5.6 09/18/2019   HGB 15.7 09/18/2019   HCT 46.1 09/18/2019   MCV 92 09/18/2019   PLT 354 09/18/2019   Lab Results  Component Value Date   FERRITIN 41 12/30/2017    Ref. Range 09/18/2019 16:14  Vitamin D, 25-Hydroxy Latest Ref Range: 30.0 - 100.0 ng/mL 31.7    Attestation Statements:   Reviewed by clinician on day of visit: allergies, medications, problem list, medical history, surgical history, family history, social history, and previous encounter notes.  09/20/2019, am acting as Cristi Loron for Energy manager.  Adair Patter, MD.  I have reviewed the above documentation for accuracy and completeness, and I agree with the above. - Ilene Qua, MD

## 2019-10-09 ENCOUNTER — Telehealth (INDEPENDENT_AMBULATORY_CARE_PROVIDER_SITE_OTHER): Payer: 59 | Admitting: Family Medicine

## 2019-10-09 ENCOUNTER — Encounter: Payer: Self-pay | Admitting: Family Medicine

## 2019-10-09 DIAGNOSIS — F332 Major depressive disorder, recurrent severe without psychotic features: Secondary | ICD-10-CM | POA: Diagnosis not present

## 2019-10-09 DIAGNOSIS — F909 Attention-deficit hyperactivity disorder, unspecified type: Secondary | ICD-10-CM

## 2019-10-09 DIAGNOSIS — E6609 Other obesity due to excess calories: Secondary | ICD-10-CM | POA: Insufficient documentation

## 2019-10-09 DIAGNOSIS — Z6831 Body mass index (BMI) 31.0-31.9, adult: Secondary | ICD-10-CM | POA: Diagnosis not present

## 2019-10-09 DIAGNOSIS — E66811 Obesity, class 1: Secondary | ICD-10-CM | POA: Insufficient documentation

## 2019-10-09 DIAGNOSIS — E669 Obesity, unspecified: Secondary | ICD-10-CM

## 2019-10-09 MED ORDER — VIIBRYD 40 MG PO TABS: 40.0000 mg | ORAL_TABLET | Freq: Every day | ORAL | 1 refills | Status: DC

## 2019-10-09 NOTE — Assessment & Plan Note (Signed)
She says for now she like to stick with the 20 mg Adderall dose.  Will monitor for the increased anxiety feeling and sleep change.  We will go ahead and refill medication in March.

## 2019-10-09 NOTE — Progress Notes (Signed)
Virtual Visit via Video Note  I connected with Anita Tanner on 10/09/19 at  3:00 PM EST by a video enabled telemedicine application and verified that I am speaking with the correct person using two identifiers.   I discussed the limitations of evaluation and management by telemedicine and the availability of in person appointments. The patient expressed understanding and agreed to proceed.  Subjective:    CC: Mood.    HPI:   Follow-up major depressive disorder-when I last saw her in December we decided to increase her Viibryd dose.  She feels she is actually doing really well on the increased Viibryd dose.  She is not had any problems or concerns.  Does feel bored at times but does feel like school is going to ramp up some.  In regards to her ADHD we also increase her Adderall from 15 mg to 20 mg.  She said she did notice that she would feel just a little bit jittery an hour or so after taking the medication but then it would tend to ease off.  She says she did not notice it when she first increased her dose she noticed it more when she started back to in person classes in January.  She also reports that she was having a little bit more difficulty sleeping she would wake up after 4 hours most like clockwork every night again right after she started back to in person classes it was not immediately after increasing her Viibryd and Adderall dose.  She feels like that is gradually been getting a little bit better but she is interested in possibly talking with a therapist again.  [facility] also [facility] at [facility]  We had referred her there right before the holidays.  She had gained some weight since Covid hit and has not been as active.   Past medical history, Surgical history, Family history not pertinant except as noted below, Social history, Allergies, and medications have been entered into the medical record, reviewed, and corrections made.   Review of Systems: No  fevers, chills, night sweats, weight loss, chest pain, or shortness of breath.   Objective:    General: Speaking clearly in complete sentences without any shortness of breath.  Alert and oriented x3.  Normal judgment. No apparent acute distress.    Impression and Recommendations:   MDD (major depressive disorder), recurrent severe, without psychosis Being well on increased dose of Viibryd.  Continue with 40 mg dose.  Refills sent to pharmacy.  We discussed options including referring her to [facility] who is doing virtual appointments right now.  She also has the option of going through her college as well but that she is a little bit hesitant about that.  ADHD (attention deficit hyperactivity disorder) She says for now she like to stick with the 20 mg Adderall dose.  Will monitor for the increased anxiety feeling and sleep change.  We will go ahead and refill medication in March.  Class 1 obesity with serious comorbidity and body mass index (BMI) of 31.0 to 31.9 in adult Now working with [facility]. She has had 2 appt yet.      I discussed the assessment and treatment plan with the patient. The patient was provided an opportunity to ask questions and all were answered. The patient agreed with the plan and demonstrated an understanding of the instructions.   The patient was advised to call back or seek an in-person evaluation if the symptoms  worsen or if the condition fails to improve as anticipated.   Beatrice Lecher, MD

## 2019-10-09 NOTE — Progress Notes (Signed)
Doing well on current regimen .  

## 2019-10-09 NOTE — Assessment & Plan Note (Signed)
Now working with [facility]. She has had 2 appt yet.

## 2019-10-09 NOTE — Assessment & Plan Note (Addendum)
Being well on increased dose of Viibryd.  Continue with 40 mg dose.  Refills sent to pharmacy.  We discussed options including referring her to [facility] who is doing virtual appointments right now.  She also has the option of going through her college as well but that she is a little bit hesitant about that.

## 2019-10-12 MED FILL — VIIBRYD 40 MG TABLET: 40 | 30 days supply | Qty: 30 | Fill #1

## 2019-10-23 ENCOUNTER — Ambulatory Visit (INDEPENDENT_AMBULATORY_CARE_PROVIDER_SITE_OTHER): Payer: 59 | Admitting: Family Medicine

## 2019-10-30 ENCOUNTER — Ambulatory Visit (INDEPENDENT_AMBULATORY_CARE_PROVIDER_SITE_OTHER): Payer: 59 | Admitting: Bariatrics

## 2019-11-09 ENCOUNTER — Encounter: Payer: Self-pay | Admitting: Family Medicine

## 2019-11-10 ENCOUNTER — Other Ambulatory Visit: Payer: Self-pay | Admitting: Family Medicine

## 2019-11-10 MED ORDER — AMPHETAMINE-DEXTROAMPHET ER 20 MG PO CP24: 20.0000 mg | ORAL_CAPSULE | ORAL | 0 refills | Status: DC

## 2019-11-10 MED FILL — VIIBRYD 40 MG TABLET: 40 | 90 days supply | Qty: 90 | Fill #0

## 2019-11-10 NOTE — Telephone Encounter (Signed)
RX pended.  Last visit (video) 10/09/19  Last RX sent 08/07/19 for #90

## 2019-11-20 ENCOUNTER — Other Ambulatory Visit: Payer: Self-pay | Admitting: Family Medicine

## 2019-11-20 NOTE — Telephone Encounter (Signed)
Already sent 10 days ago

## 2019-12-04 ENCOUNTER — Other Ambulatory Visit: Payer: Self-pay | Admitting: Family Medicine

## 2019-12-04 NOTE — Telephone Encounter (Signed)
We may need to call the pharmacy and or patient to find out what is going on.  We just sent a 90-day supply a month ago and they are requesting a refill.  Where they only able to fill a month supply for her?  We normally do 90-day and that normally goes through with her insurance but if something has changed then please let us know.

## 2019-12-05 NOTE — Telephone Encounter (Signed)
Spoke w/Amanda and she stated that this was never picked up.

## 2019-12-06 ENCOUNTER — Other Ambulatory Visit: Payer: Self-pay | Admitting: Family Medicine

## 2019-12-06 MED ORDER — AMPHETAMINE-DEXTROAMPHET ER 20 MG PO CP24: 20.0000 mg | ORAL_CAPSULE | ORAL | 0 refills | Status: DC

## 2019-12-06 MED FILL — ADDERALL XR 20 MG CAP SA: 20 | 90 days supply | Qty: 90 | Fill #0

## 2019-12-06 NOTE — Telephone Encounter (Signed)
Okay, please call patient and let her know that she needs to call her pharmacy for the refill that is already available.

## 2019-12-06 NOTE — Telephone Encounter (Signed)
She has to get this from [facility]. The original one was from [facility]. So it has to be sent to [facility]

## 2019-12-06 NOTE — Telephone Encounter (Signed)
Okay.  Medication recent to [facility].

## 2020-02-29 MED FILL — VIIBRYD 40 MG TABLET: 40 | 90 days supply | Qty: 90 | Fill #1

## 2020-03-25 ENCOUNTER — Other Ambulatory Visit: Payer: Self-pay | Admitting: Family Medicine

## 2020-03-25 NOTE — Telephone Encounter (Signed)
Patient needs appointment has not been seen in my 6 months.  We can always send in a short supply until her appointment but we cannot fill a 90-day.

## 2020-03-25 NOTE — Telephone Encounter (Signed)
Last ov- 10/09/2019  Last refill-12/06/2019

## 2020-03-26 MED FILL — ADDERALL XR 20 MG CAP SA: 20 | 30 days supply | Qty: 30 | Fill #0

## 2020-03-26 NOTE — Telephone Encounter (Signed)
LVM advising pt that Dr. Linford Arnold can only send in a short supply of Adderall until she can schedule her regular f/u appt for medication. Asked that she return call to schedule an appt or go online to her my chart to schedule this.

## 2020-05-08 NOTE — Progress Notes (Signed)
Established Patient Office Visit  Subjective:  Patient ID: Anita Tanner, female    DOB: 2000/08/25  Age: 19 y.o. MRN: [id number]  CC:  Chief Complaint  Patient presents with  . ADD  . mood    HPI [name] presents for   ADD - Reports symptoms are well controlled on current regime. Denies any problems with insomnia, chest pain, palpitations, or SOB.    She does not really like to discuss losing weight she actually went [facility] the [facility] for about 4 visits she said unfortunately because of her budget right now she could not continue to go regularly.  She has downloaded some apps and has really been trying to do better she states some interval training over the summer and was able to lose about 10 pounds unfortunately she just keeps going up and down about 5 pounds and cannot seem to be consistent.  She does feel like she is eating healthier than she used to.  And she is really been working on portion control.  Right now she is not exercising regularly.  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  Follow-up major depressive disorder-she feels like she is actually doing really well on her Viibryd.  She actually ran out of her Wellbutrin about 2 weeks ago and says she really has not noticed a difference.  She does have some exciting news she actually recently got engaged.  They're planning a wedding for next summer.  Past Medical History:  Diagnosis Date  . ADD (attention deficit disorder)   . Anxiety   . Chest pain   . Depression   . Fracture of wrist    left 2008, bilat 2011, right 2013    Past Surgical History:  Procedure Laterality Date  . TYMPANOSTOMY TUBE PLACEMENT  2002    Family History  Problem Relation Age of Onset  . Hyperlipidemia Mother   . Depression Mother   . Anxiety disorder Mother   . Coronary artery disease Father   . ADD / ADHD Father   . Hyperlipidemia Father    . Heart disease Father   . Sleep apnea Father     Social History   Socioeconomic History  . Marital status: Single    Spouse name: Not on file  . Number of children: Not on file  . Years of education: Not on file  . Highest education level: Not on file  Occupational History  . Not on file  Tobacco Use  . Smoking status: Former Smoker    Quit date: 2018    Years since quitting: 3.7  . Smokeless tobacco: Never Used  Vaping Use  . Vaping Use: Every day  Substance and Sexual Activity  . Alcohol use: No  . Drug use: No  . Sexual activity: Yes    Birth control/protection: Pill  Other Topics Concern  . Not on file  Social History Narrative   No sexually active.  In new school.     Social Determinants of Health   Financial Resource Strain:   . Difficulty of Paying Living Expenses: Not on file  Food Insecurity:   . Worried About Programme researcher, broadcasting/film/video in the Last Year: Not on file  . Ran Out of Food in the Last Year: Not on file  Transportation Needs:   . Lack of Transportation (Medical): Not on file  . Lack of Transportation (  Non-Medical): Not on file  Physical Activity:   . Days of Exercise per Week: Not on file  . Minutes of Exercise per Session: Not on file  Stress:   . Feeling of Stress : Not on file  Social Connections:   . Frequency of Communication with Friends and Family: Not on file  . Frequency of Social Gatherings with Friends and Family: Not on file  . Attends Religious Services: Not on file  . Active Member of Clubs or Organizations: Not on file  . Attends Banker Meetings: Not on file  . Marital Status: Not on file  Intimate Partner Violence:   . Fear of Current or Ex-Partner: Not on file  . Emotionally Abused: Not on file  . Physically Abused: Not on file  . Sexually Abused: Not on file    Outpatient Medications Prior to Visit  Medication Sig Dispense Refill  . ipratropium (ATROVENT) 0.03 % nasal spray Place 2 sprays into both nostrils  3 (three) times daily as needed for rhinitis. 30 mL 12  . Multiple Vitamin (MULTIVITAMIN WITH MINERALS) TABS tablet Take 1 tablet by mouth daily.    Marland Kitchen VIIBRYD 40 MG TABS TAKE 1 TABLET (40 MG TOTAL) BY MOUTH DAILY. 90 tablet 1  . amphetamine-dextroamphetamine (ADDERALL XR) 20 MG 24 hr capsule Take 1 capsule (20 mg total) by mouth in the morning. APPOINTMENT REQUIRED FOR 90 SUPPLY. 30 capsule 0  . buPROPion (WELLBUTRIN XL) 150 MG 24 hr tablet Take 1 tablet (150 mg total) by mouth every morning. 90 tablet 1  . Vitamin D, Ergocalciferol, (DRISDOL) 1.25 MG (50000 UNIT) CAPS capsule Take 1 capsule (50,000 Units total) by mouth every 7 (seven) days. 4 capsule 0   No facility-administered medications prior to visit.    Allergies  Allergen Reactions  . Prozac [Fluoxetine Hcl] Other (See Comments)    Weight gain    ROS Review of Systems    Objective:    Physical Exam Constitutional:      Appearance: She is well-developed.  HENT:     Head: Normocephalic and atraumatic.  Cardiovascular:     Rate and Rhythm: Normal rate and regular rhythm.     Heart sounds: Normal heart sounds.  Pulmonary:     Effort: Pulmonary effort is normal.     Breath sounds: Normal breath sounds.  Skin:    General: Skin is warm and dry.  Neurological:     Mental Status: She is alert and oriented to person, place, and time.  Psychiatric:        Behavior: Behavior normal.     BP 120/60   Pulse 80   Ht 5\' 3"  (1.6 m)   Wt 178 lb (80.7 kg)   LMP 04/02/2020 (Approximate)   SpO2 100%   BMI 31.53 kg/m  Wt Readings from Last 3 Encounters:  05/09/20 178 lb (80.7 kg)  10/02/19 178 lb (80.7 kg)  09/18/19 180 lb (81.6 kg)     Health Maintenance Due  Topic Date Due  . Hepatitis C Screening  Never done  . CHLAMYDIA SCREENING  09/28/2019  . PAP-Cervical Cytology Screening  Never done  . PAP SMEAR-Modifier  Never done    There are no preventive care reminders to display for this patient.  Lab Results   Component Value Date   TSH 1.890 09/18/2019   Lab Results  Component Value Date   WBC 5.6 09/18/2019   HGB 15.7 09/18/2019   HCT 46.1 09/18/2019   MCV 92 09/18/2019  PLT 354 09/18/2019   Lab Results  Component Value Date   NA 141 09/18/2019   K 4.2 09/18/2019   CO2 23 09/18/2019   GLUCOSE 81 09/18/2019   BUN 8 09/18/2019   CREATININE 0.62 09/18/2019   BILITOT 0.6 09/18/2019   ALKPHOS 111 09/18/2019   AST 21 09/18/2019   ALT 25 09/18/2019   PROT 6.8 09/18/2019   ALBUMIN 4.6 09/18/2019   CALCIUM 9.4 09/18/2019   ANIONGAP 10 04/09/2015   Lab Results  Component Value Date   CHOL 233 (H) 09/18/2019   Lab Results  Component Value Date   HDL 49 09/18/2019   Lab Results  Component Value Date   LDLCALC 163 (H) 09/18/2019   Lab Results  Component Value Date   TRIG 118 09/18/2019   No results found for: [facility] Lab Results  Component Value Date   HGBA1C 5.0 09/18/2019      Assessment & Plan:   Problem List Items Addressed This Visit      Other   MDD (major depressive disorder), recurrent severe, without psychosis (HCC) (Chronic)    Continue Wellbutrin completely since she is already been on for 2 weeks and has not noticed any change in her mood she is happy with the Viibryd dose will make sure she has refills.  Otherwise follow-up in 6 months.      Class 1 obesity with serious comorbidity and body mass index (BMI) of 31.0 to 31.9 in adult    Discussed using one of the calorie apps to help set goals.  And getting back into some routine exercise if she is able to though she is working and doing school which doesn't allow a lot of time but she actually did really well with interval training.  We discussed making sure eating small amounts regularly and try not to skip meals.  Really want her to create good lifestyle changes that she can use for the rest of her life.  She says she is just frustrated so we did discuss an option of weight loss medications but explained  that there are definitely pros and cons and sometimes we see rebound with weight gain with these medications.  But I did give her a list that she could check with her insurance to see if it may or may not be covered.  Be happy to discuss in further detail in the future if she would like.      Relevant Medications   amphetamine-dextroamphetamine (ADDERALL XR) 20 MG 24 hr capsule   ADHD (attention deficit hyperactivity disorder) - Primary    Well on current regimen.  Blood pressure at goal.  Follow-up in 4 months.      Relevant Medications   amphetamine-dextroamphetamine (ADDERALL XR) 20 MG 24 hr capsule    Other Visit Diagnoses    Need for immunization against influenza       Relevant Orders   Flu Vaccine QUAD 36+ mos IM (Completed)   Need for diphtheria-tetanus-pertussis (Tdap) vaccine       Relevant Orders   Tdap vaccine greater than or equal to 7yo IM (Completed)      Vaccine and Tdap given today.  Meds ordered this encounter  Medications  . amphetamine-dextroamphetamine (ADDERALL XR) 20 MG 24 hr capsule    Sig: Take 1 capsule (20 mg total) by mouth in the morning.    Dispense:  90 capsule    Refill:  0    Follow-up: Return in about 4 months (around 09/08/2020) for ADD.  Beatrice Lecher, MD

## 2020-05-09 ENCOUNTER — Ambulatory Visit (INDEPENDENT_AMBULATORY_CARE_PROVIDER_SITE_OTHER): Payer: 59 | Admitting: Family Medicine

## 2020-05-09 ENCOUNTER — Encounter: Payer: Self-pay | Admitting: Family Medicine

## 2020-05-09 VITALS — BP 120/60 | HR 80 | Ht 63.0 in | Wt 178.0 lb

## 2020-05-09 DIAGNOSIS — E669 Obesity, unspecified: Secondary | ICD-10-CM | POA: Diagnosis not present

## 2020-05-09 DIAGNOSIS — F332 Major depressive disorder, recurrent severe without psychotic features: Secondary | ICD-10-CM

## 2020-05-09 DIAGNOSIS — Z6831 Body mass index (BMI) 31.0-31.9, adult: Secondary | ICD-10-CM | POA: Diagnosis not present

## 2020-05-09 DIAGNOSIS — F909 Attention-deficit hyperactivity disorder, unspecified type: Secondary | ICD-10-CM | POA: Diagnosis not present

## 2020-05-09 DIAGNOSIS — Z23 Encounter for immunization: Secondary | ICD-10-CM

## 2020-05-09 MED ORDER — AMPHETAMINE-DEXTROAMPHET ER 20 MG PO CP24: 20.0000 mg | ORAL_CAPSULE | Freq: Every morning | ORAL | 0 refills | Status: DC

## 2020-05-09 NOTE — Assessment & Plan Note (Addendum)
Well on current regimen.  Blood pressure at goal.  Follow-up in 4 months.

## 2020-05-09 NOTE — Assessment & Plan Note (Addendum)
Continue Wellbutrin completely since she is already been on for 2 weeks and has not noticed any change in her mood she is happy with the Viibryd dose will make sure she has refills.  Otherwise follow-up in 6 months.

## 2020-05-09 NOTE — Progress Notes (Signed)
Pt reports that she hasn't taken the Wellbutrin in 2 wks due to running out and felt like it really wasn't helping her anyway and would like to discontinue this.   She also asked about when she had her IUD placed. I looked back and she had the [place] done on 08/10/2017 this is a 5 year IUD which can be removed in December 2023.

## 2020-05-09 NOTE — Patient Instructions (Signed)
Can check with the insurance to see if they were cover one of the newer weight loss drugs such as Saxenda, [place], Contrave, or Qsymia.   If not there are some off label medications that are relatively inexpensive that we could consider such as topiramate, or Metformin.

## 2020-05-09 NOTE — Assessment & Plan Note (Addendum)
Discussed using one of the calorie apps to help set goals.  And getting back into some routine exercise if she is able to though she is working and doing school which doesn't allow a lot of time but she actually did really well with interval training.  We discussed making sure eating small amounts regularly and try not to skip meals.  Really want her to create good lifestyle changes that she can use for the rest of her life.  She says she is just frustrated so we did discuss an option of weight loss medications but explained that there are definitely pros and cons and sometimes we see rebound with weight gain with these medications.  But I did give her a list that she could check with her insurance to see if it may or may not be covered.  Be happy to discuss in further detail in the future if she would like.

## 2020-05-16 ENCOUNTER — Other Ambulatory Visit: Payer: Self-pay | Admitting: Family Medicine

## 2020-05-16 DIAGNOSIS — F909 Attention-deficit hyperactivity disorder, unspecified type: Secondary | ICD-10-CM

## 2020-05-16 MED ORDER — AMPHETAMINE-DEXTROAMPHET ER 20 MG PO CP24: 20.0000 mg | ORAL_CAPSULE | Freq: Every morning | ORAL | 0 refills | Status: DC

## 2020-05-16 MED FILL — ADDERALL XR 20 MG CAP SA: 20 | 90 days supply | Qty: 90 | Fill #0

## 2020-05-21 ENCOUNTER — Encounter: Payer: Self-pay | Admitting: Family Medicine

## 2020-05-28 NOTE — Telephone Encounter (Signed)
Yes have her schedule if she is able to get the medication. She will have to work with pharmacy and her insurance on cost

## 2020-06-26 ENCOUNTER — Other Ambulatory Visit: Payer: Self-pay | Admitting: Family Medicine

## 2020-06-26 MED FILL — VIIBRYD 40 MG TABLET: 40 | 90 days supply | Qty: 90 | Fill #0

## 2020-06-27 ENCOUNTER — Encounter: Payer: Self-pay | Admitting: Family Medicine

## 2020-06-27 DIAGNOSIS — E669 Obesity, unspecified: Secondary | ICD-10-CM

## 2020-06-27 DIAGNOSIS — Z6831 Body mass index (BMI) 31.0-31.9, adult: Secondary | ICD-10-CM

## 2020-07-05 MED ORDER — WEGOVY 0.25 MG/0.5ML ~~LOC~~ SOAJ: 0.2500 mg | SUBCUTANEOUS | 0 refills | Status: DC

## 2020-07-11 ENCOUNTER — Telehealth: Payer: Self-pay

## 2020-07-11 NOTE — Telephone Encounter (Signed)
Patient called stating she has an appt scheduled for tomorrow, but she is having increasingly worsening pain on her right side of abdomen. She states she has had some waves of nausea but no vomiting nor diarrhea. Patient states she doesn't think she can wait until appt tomorrow to be seen.   I advised patient that we do not have any appointments available at our office today, but she is welcome to go to urgent care for evaluation to make sur there is nothing wrong that needs to be addressed urgently. Patient agreeable with plan, will go when she gets off work at 5:00. We are keeping her appt here tomorrow at this time and she will call in the AM to let us know if she wants to keep or cancel this appt.

## 2020-07-12 ENCOUNTER — Ambulatory Visit (INDEPENDENT_AMBULATORY_CARE_PROVIDER_SITE_OTHER): Payer: 59

## 2020-07-12 ENCOUNTER — Other Ambulatory Visit: Payer: Self-pay

## 2020-07-12 ENCOUNTER — Other Ambulatory Visit: Payer: Self-pay | Admitting: Physician Assistant

## 2020-07-12 ENCOUNTER — Encounter: Payer: Self-pay | Admitting: Physician Assistant

## 2020-07-12 ENCOUNTER — Ambulatory Visit: Payer: 59 | Admitting: Physician Assistant

## 2020-07-12 VITALS — BP 114/71 | HR 100 | Temp 98.6°F | Ht 63.0 in | Wt 178.0 lb

## 2020-07-12 DIAGNOSIS — Z6831 Body mass index (BMI) 31.0-31.9, adult: Secondary | ICD-10-CM | POA: Diagnosis not present

## 2020-07-12 DIAGNOSIS — R10811 Right upper quadrant abdominal tenderness: Secondary | ICD-10-CM | POA: Diagnosis not present

## 2020-07-12 DIAGNOSIS — R17 Unspecified jaundice: Secondary | ICD-10-CM | POA: Diagnosis not present

## 2020-07-12 DIAGNOSIS — E6609 Other obesity due to excess calories: Secondary | ICD-10-CM | POA: Diagnosis not present

## 2020-07-12 DIAGNOSIS — R1011 Right upper quadrant pain: Secondary | ICD-10-CM

## 2020-07-12 DIAGNOSIS — K824 Cholesterolosis of gallbladder: Secondary | ICD-10-CM | POA: Insufficient documentation

## 2020-07-12 DIAGNOSIS — R079 Chest pain, unspecified: Secondary | ICD-10-CM | POA: Diagnosis not present

## 2020-07-12 DIAGNOSIS — R109 Unspecified abdominal pain: Secondary | ICD-10-CM

## 2020-07-12 LAB — POCT URINALYSIS DIP (CLINITEK)
Bilirubin, UA: NEGATIVE
Blood, UA: NEGATIVE
Glucose, UA: NEGATIVE mg/dL
Nitrite, UA: NEGATIVE
POC PROTEIN,UA: NEGATIVE
Spec Grav, UA: 1.02 (ref 1.010–1.025)
Urobilinogen, UA: 1 E.U./dL
pH, UA: 7.5 (ref 5.0–8.0)

## 2020-07-12 MED ORDER — TRAMADOL HCL 50 MG PO TABS
50.0000 mg | ORAL_TABLET | Freq: Four times a day (QID) | ORAL | 0 refills | Status: DC | PRN
Start: 2020-07-12 — End: 2020-07-12

## 2020-07-12 MED FILL — traMADol HCL 50 MG TABS: 50 | 5 days supply | Qty: 20 | Fill #0

## 2020-07-12 NOTE — Progress Notes (Signed)
No evidence of gallbladder disease. We do see polyp on the gallbladder wall that look benign(not worrisome) but should be followed up on in one year. Normal liver.   Waiting to get labs back to look for pancreatitis. Would you like something for pain to be sent over?

## 2020-07-12 NOTE — Progress Notes (Signed)
Ok sent tramadol for pain. Awaiting labs for next step.

## 2020-07-12 NOTE — Progress Notes (Deleted)
Tuesday started RUQ pain with deep breaths and moving Then changed to all the time Not related to food No nausea Is more gassy than usual  Moving bowels No reflux  Chest pain started yesterday  No urinary symptoms  Eating  No injuryies  No cough or  wegovy-just started.

## 2020-07-12 NOTE — Patient Instructions (Signed)
Abdominal Pain, Adult Pain in the abdomen (abdominal pain) can be caused by many things. Often, abdominal pain is not serious and it gets better with no treatment or by being treated at home. However, sometimes abdominal pain is serious. Your health care provider will ask questions about your medical history and do a physical exam to try to determine the cause of your abdominal pain. Follow these instructions at home:  Medicines  Take over-the-counter and prescription medicines only as told by your health care provider.  Do not take a laxative unless told by your health care provider. General instructions  Watch your condition for any changes.  Drink enough fluid to keep your urine pale yellow.  Keep all follow-up visits as told by your health care provider. This is important. Contact a health care provider if:  Your abdominal pain changes or gets worse.  You are not hungry or you lose weight without trying.  You are constipated or have diarrhea for more than 2-3 days.  You have pain when you urinate or have a bowel movement.  Your abdominal pain wakes you up at night.  Your pain gets worse with meals, after eating, or with certain foods.  You are vomiting and cannot keep anything down.  You have a fever.  You have blood in your urine. Get help right away if:  Your pain does not go away as soon as your health care provider told you to expect.  You cannot stop vomiting.  Your pain is only in areas of the abdomen, such as the right side or the left lower portion of the abdomen. Pain on the right side could be caused by appendicitis.  You have bloody or black stools, or stools that look like tar.  You have severe pain, cramping, or bloating in your abdomen.  You have signs of dehydration, such as: ? Dark urine, very little urine, or no urine. ? Cracked lips. ? Dry mouth. ? Sunken eyes. ? Sleepiness. ? Weakness.  You have trouble breathing or chest  pain. Summary  Often, abdominal pain is not serious and it gets better with no treatment or by being treated at home. However, sometimes abdominal pain is serious.  Watch your condition for any changes.  Take over-the-counter and prescription medicines only as told by your health care provider.  Contact a health care provider if your abdominal pain changes or gets worse.  Get help right away if you have severe pain, cramping, or bloating in your abdomen. This information is not intended to replace advice given to you by your health care provider. Make sure you discuss any questions you have with your health care provider. Document Revised: 12/19/2018 Document Reviewed: 12/19/2018 Elsevier Patient Education  2020 [employer] Inc.  

## 2020-07-13 LAB — CBC WITH DIFFERENTIAL/PLATELET
Absolute Monocytes: 686 {cells}/uL (ref 200–950)
Basophils Absolute: 20 {cells}/uL (ref 0–200)
Basophils Relative: 0.2 %
Eosinophils Absolute: 59 {cells}/uL (ref 15–500)
Eosinophils Relative: 0.6 %
HCT: 44.1 % (ref 35.0–45.0)
Hemoglobin: 15 g/dL (ref 11.7–15.5)
Lymphs Abs: 1617 {cells}/uL (ref 850–3900)
MCH: 31.6 pg (ref 27.0–33.0)
MCHC: 34 g/dL (ref 32.0–36.0)
MCV: 92.8 fL (ref 80.0–100.0)
MPV: 10 fL (ref 7.5–12.5)
Monocytes Relative: 7 %
Neutro Abs: 7419 {cells}/uL (ref 1500–7800)
Neutrophils Relative %: 75.7 %
Platelets: 287 Thousand/uL (ref 140–400)
RBC: 4.75 Million/uL (ref 3.80–5.10)
RDW: 11.9 % (ref 11.0–15.0)
Total Lymphocyte: 16.5 %
WBC: 9.8 Thousand/uL (ref 3.8–10.8)

## 2020-07-13 LAB — COMPLETE METABOLIC PANEL WITHOUT GFR
AG Ratio: 1.6 (calc) (ref 1.0–2.5)
ALT: 23 U/L (ref 6–29)
AST: 17 U/L (ref 10–30)
Albumin: 4.4 g/dL (ref 3.6–5.1)
Alkaline phosphatase (APISO): 106 U/L (ref 31–125)
BUN: 7 mg/dL (ref 7–25)
CO2: 27 mmol/L (ref 20–32)
Calcium: 9.5 mg/dL (ref 8.6–10.2)
Chloride: 102 mmol/L (ref 98–110)
Creat: 0.66 mg/dL (ref 0.50–1.10)
GFR, Est African American: 146 mL/min/1.73m2
GFR, Est Non African American: 126 mL/min/1.73m2
Globulin: 2.7 g/dL (ref 1.9–3.7)
Glucose, Bld: 81 mg/dL (ref 65–99)
Potassium: 3.9 mmol/L (ref 3.5–5.3)
Sodium: 139 mmol/L (ref 135–146)
Total Bilirubin: 1.4 mg/dL — ABNORMAL HIGH (ref 0.2–1.2)
Total Protein: 7.1 g/dL (ref 6.1–8.1)

## 2020-07-13 LAB — LIPASE: Lipase: 12 U/L (ref 7–60)

## 2020-07-15 ENCOUNTER — Other Ambulatory Visit: Payer: Self-pay | Admitting: Physician Assistant

## 2020-07-15 ENCOUNTER — Encounter (INDEPENDENT_AMBULATORY_CARE_PROVIDER_SITE_OTHER): Payer: Self-pay | Admitting: Family Medicine

## 2020-07-15 ENCOUNTER — Encounter: Payer: Self-pay | Admitting: Physician Assistant

## 2020-07-15 DIAGNOSIS — R071 Chest pain on breathing: Secondary | ICD-10-CM

## 2020-07-15 DIAGNOSIS — R17 Unspecified jaundice: Secondary | ICD-10-CM | POA: Insufficient documentation

## 2020-07-15 DIAGNOSIS — R10811 Right upper quadrant abdominal tenderness: Secondary | ICD-10-CM | POA: Insufficient documentation

## 2020-07-15 LAB — URINE CULTURE

## 2020-07-15 NOTE — Progress Notes (Addendum)
Subjective:    Patient ID: Anita Tanner, female    DOB: [birth year], 19 y.o.   MRN: [id number]  HPI  Patient is a 19 year old female who presents to the clinic with 4 days of right upper quadrant pain that is worse with deep breath and moving.  It was more intermittent and changing to be more persistent.  She does not recognize that it is related to food.  Patient denies any nausea, vomiting, fever, acid reflux.  She is having fairly normal bowel movements may be a little looser and a little more gassy than she normally is.  She denies any melena or hematochezia.  Her chest pains and radiation to back started yesterday.  She denies any urinary symptoms.  She denies any injuries, cough, shortness of breath, loss of smell or taste.  Patient has not started any new medications.  She was sent for W but she has not started it yet.   .. Active Ambulatory Problems    Diagnosis Date Noted  . CONSTIPATION 06/30/2010  . ALLERGIC RHINITIS 01/05/2011  . EPISTAXIS, RECURRENT 01/05/2011  . ADHD (attention deficit hyperactivity disorder) 05/16/2012  . Anxiety and depression 08/09/2012  . Migraine headache 01/05/2014  . MDD (major depressive disorder), recurrent severe, without psychosis (HCC) 04/10/2015  . Iron deficiency anemia due to chronic blood loss 12/16/2016  . Abnormal weight gain 08/07/2019  . Class 1 obesity due to excess calories without serious comorbidity with body mass index (BMI) of 31.0 to 31.9 in adult 10/09/2019  . Gallbladder polyp 07/12/2020  . Elevated bilirubin 07/15/2020  . Right upper quadrant abdominal tenderness without rebound tenderness 07/15/2020   Resolved Ambulatory Problems    Diagnosis Date Noted  . Abdominal pain, generalized 06/30/2010  . Headache(784.0) 04/21/2011  . Boxer's fracture right hand 05/06/2012  . Sprain of right middle finger 11/14/2012  . Depressive disorder 04/10/2015  . Lice 12/24/2015  . URI (upper respiratory infection) 04/23/2016  .  Nexplanon insertion 06/01/2017   Past Medical History:  Diagnosis Date  . ADD (attention deficit disorder)   . Anxiety   . Chest pain   . Depression   . Fracture of wrist       Review of Systems See HPI.     Objective:   Physical Exam Vitals reviewed.  Constitutional:      Appearance: She is well-developed. She is obese.  HENT:     Head: Normocephalic.  Cardiovascular:     Rate and Rhythm: Regular rhythm. Tachycardia present.  Pulmonary:     Effort: Pulmonary effort is normal.  Abdominal:     General: Bowel sounds are increased. There is no distension.     Palpations: Abdomen is soft.     Tenderness: There is abdominal tenderness in the right upper quadrant and epigastric area. There is no right CVA tenderness, left CVA tenderness, guarding or rebound. Negative signs include Murphy's sign, Rovsing's sign, McBurney's sign, psoas sign and obturator sign.     Hernia: No hernia is present.  Neurological:     General: No focal deficit present.     Mental Status: She is alert.  Psychiatric:        Mood and Affect: Mood normal.       .. Results for orders placed or performed in visit on 07/12/20  CBC with Differential/Platelet  Result Value Ref Range   WBC 9.8 3.8 - 10.8 Thousand/uL   RBC 4.75 3.80 - 5.10 Million/uL   Hemoglobin 15.0 11.7 - 15.5 g/dL  HCT 44.1 35 - 45 %   MCV 92.8 80.0 - 100.0 fL   MCH 31.6 27.0 - 33.0 pg   MCHC 34.0 32.0 - 36.0 g/dL   RDW 19.1 47.8 - 29.5 %   Platelets 287 140 - 400 Thousand/uL   MPV 10.0 7.5 - 12.5 fL   Neutro Abs 7,419 1,500 - 7,800 cells/uL   Lymphs Abs 1,617 850 - 3,900 cells/uL   Absolute Monocytes 686 200 - 950 cells/uL   Eosinophils Absolute 59 15.0 - 500.0 cells/uL   Basophils Absolute 20 0.0 - 200.0 cells/uL   Neutrophils Relative % 75.7 %   Total Lymphocyte 16.5 %   Monocytes Relative 7.0 %   Eosinophils Relative 0.6 %   Basophils Relative 0.2 %  COMPLETE METABOLIC PANEL WITH GFR  Result Value Ref Range    Glucose, Bld 81 65 - 99 mg/dL   BUN 7 7 - 25 mg/dL   Creat 6.21 3.08 - 6.57 mg/dL   GFR, Est Non African American 126 > OR = 60 mL/min/1.64m2   GFR, Est African American 146 > OR = 60 mL/min/1.37m2   BUN/Creatinine Ratio NOT APPLICABLE 6 - 22 (calc)   Sodium 139 135 - 146 mmol/L   Potassium 3.9 3.5 - 5.3 mmol/L   Chloride 102 98 - 110 mmol/L   CO2 27 20 - 32 mmol/L   Calcium 9.5 8.6 - 10.2 mg/dL   Total Protein 7.1 6.1 - 8.1 g/dL   Albumin 4.4 3.6 - 5.1 g/dL   Globulin 2.7 1.9 - 3.7 g/dL (calc)   AG Ratio 1.6 1.0 - 2.5 (calc)   Total Bilirubin 1.4 (H) 0.2 - 1.2 mg/dL   Alkaline phosphatase (APISO) 106 31 - 125 U/L   AST 17 10 - 30 U/L   ALT 23 6 - 29 U/L  Lipase  Result Value Ref Range   Lipase 12 7 - 60 U/L  POCT URINALYSIS DIP (CLINITEK)  Result Value Ref Range   Color, UA yellow yellow   Clarity, UA clear clear   Glucose, UA negative negative mg/dL   Bilirubin, UA negative negative   Ketones, POC UA moderate (40) (A) negative mg/dL   Spec Grav, UA 8.469 6.295 - 1.025   Blood, UA negative negative   pH, UA 7.5 5.0 - 8.0   POC PROTEIN,UA negative negative, trace   Urobilinogen, UA 1.0 0.2 or 1.0 E.U./dL   Nitrite, UA Negative Negative   Leukocytes, UA Trace (A) Negative       Assessment & Plan:  Marland KitchenMarland KitchenFreida was seen today for abdominal pain.  Diagnoses and all orders for this visit:  RUQ pain -     US ABDOMEN LIMITED RUQ (LIVER/GB) -     CBC with Differential/Platelet -     COMPLETE METABOLIC PANEL WITH GFR -     Lipase -     Ambulatory referral to Gastroenterology  Chest pain, unspecified type -     EKG 12-Lead  Right flank pain -     CBC with Differential/Platelet -     COMPLETE METABOLIC PANEL WITH GFR -     POCT URINALYSIS DIP (CLINITEK) -     Urine Culture  Right upper quadrant abdominal tenderness without rebound tenderness -     US ABDOMEN LIMITED RUQ (LIVER/GB) -     CBC with Differential/Platelet -     COMPLETE METABOLIC PANEL WITH GFR -      Lipase -     Ambulatory referral to Gastroenterology  Class 1 obesity due to excess calories without serious comorbidity with body mass index (BMI) of 31.0 to 31.9 in adult -     Ambulatory referral to Gastroenterology  Elevated bilirubin -     Ambulatory referral to Gastroenterology   EKG sinus tachycardia at 102.  No ST elevation or depression, no arrhythmias noted. Patient does have fairly significant right upper quadrant abdominal tenderness.  Will get stat right upper quadrant ultrasound.  Will check CBC, CMP, lipase. No blood in urine not likely kidney stone.  Patient has not yet started her would go obese therefore this is not a side effect to would go very. Vitals are reassuring and no fever.    Stat ultrasound of abdomen was negative for any acute findings. Bilirubin was elevated. No elevation of lipase or liver enzymes. Discussed findings with patient.  This could certainly represent some biliary dyskinesia.  We will make referral to GI.

## 2020-07-15 NOTE — Progress Notes (Signed)
Overall your labs look good. You do have a slightly elevated bilirubin. This can represent some dysfunction of the gallbladder that would not show up on ultrasound. How are you feeling today?

## 2020-07-15 NOTE — Progress Notes (Signed)
I did go ahead and order CXR if you want to run by and get that since still having pain with breathing.  I made referral to GI as well.

## 2020-07-15 NOTE — Telephone Encounter (Signed)
This message is being routed to prescriber see last

## 2020-07-22 NOTE — Progress Notes (Signed)
No UTI. Some mixed bacterial flora growth but appears like a normal variant.

## 2020-09-09 ENCOUNTER — Encounter: Payer: Self-pay | Admitting: Family Medicine

## 2020-09-09 ENCOUNTER — Telehealth (INDEPENDENT_AMBULATORY_CARE_PROVIDER_SITE_OTHER): Payer: 59 | Admitting: Family Medicine

## 2020-09-09 ENCOUNTER — Other Ambulatory Visit: Payer: Self-pay | Admitting: Family Medicine

## 2020-09-09 DIAGNOSIS — E6609 Other obesity due to excess calories: Secondary | ICD-10-CM

## 2020-09-09 DIAGNOSIS — F909 Attention-deficit hyperactivity disorder, unspecified type: Secondary | ICD-10-CM | POA: Diagnosis not present

## 2020-09-09 DIAGNOSIS — F332 Major depressive disorder, recurrent severe without psychotic features: Secondary | ICD-10-CM | POA: Diagnosis not present

## 2020-09-09 DIAGNOSIS — E669 Obesity, unspecified: Secondary | ICD-10-CM

## 2020-09-09 DIAGNOSIS — Z6831 Body mass index (BMI) 31.0-31.9, adult: Secondary | ICD-10-CM

## 2020-09-09 MED ORDER — WEGOVY 0.25 MG/0.5ML ~~LOC~~ SOAJ: 0.2500 mg | SUBCUTANEOUS | 0 refills | Status: DC

## 2020-09-09 MED ORDER — AMPHETAMINE-DEXTROAMPHET ER 25 MG PO CP24
25.0000 mg | ORAL_CAPSULE | Freq: Every morning | ORAL | 0 refills | Status: DC
Start: 2020-09-09 — End: 2020-11-01

## 2020-09-09 MED FILL — ADDERALL XR 25 MG CAPSULE: 25 | 30 days supply | Qty: 30 | Fill #0

## 2020-09-09 NOTE — Patient Instructions (Signed)
You can get a coupon / savings card on: [email or web address]

## 2020-09-09 NOTE — Assessment & Plan Note (Addendum)
Discussed options. Will in Adderall to 25mg . F/U in 1-2 mo to make sure happy with new dose. Monitor for Side effects. Also work on adequate sleep and self care to improve focus.

## 2020-09-09 NOTE — Progress Notes (Addendum)
Virtual Visit via Video Note  I connected with [name] on 09/09/20 at  8:10 AM EST by a video enabled telemedicine application and verified that I am speaking with the correct person using two identifiers.   I discussed the limitations of evaluation and management by telemedicine and the availability of in person appointments. The patient expressed understanding and agreed to proceed.  Patient location: at home Provider location: at home  Subjective:    CC: 4 mo f/u   HPI: ADD -  Denies any problems with insomnia, chest pain, palpitations, or SOB.  She feels like she is not as focused when takes her medication.  She would like to try a higher dose.  It is not wearing "off".    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 It is exciting but stressful    Overweight -she wasn't able to get the [facility].      Past medical history, Surgical history, Family history not pertinant except as noted below, Social history, Allergies, and medications have been entered into the medical record, reviewed, and corrections made.   Review of Systems: No fevers, chills, night sweats, weight loss, chest pain, or shortness of breath.   Objective:    General: Speaking clearly in complete sentences without any shortness of breath.  Alert and oriented x3.  Normal judgment. No apparent acute distress.    Impression and Recommendations:    MDD (major depressive disorder), recurrent severe, without psychosis Doing well on Viibryd.  Has another 90 day refill. F/U in 4 mo.    ADHD (attention deficit hyperactivity disorder) Discussed options. Will in Adderall to 25mg . F/U in 1-2 mo to make sure happy with new dose. Monitor for Side effects. Also work on adequate sleep and self care to improve focus.   Class 1 obesity due to excess calories without serious comorbidity with body mass  index (BMI) of 31.0 to 31.9 in adult Retry the [facility] coupon. She has already lost about 5 lbs getting on the treadmill 3 days per week at her apt complex. Congratulated her on changes and weight loss!   MDD (major depressive disorder), recurrent severe, without psychosis Doing well on Viibryd.  Has another 90 day refill. F/U in 4 mo.    ADHD (attention deficit hyperactivity disorder) Discussed options. Will in Adderall to 25mg . F/U in 1-2 mo to make sure happy with new dose. Monitor for Side effects. Also work on adequate sleep and self care to improve focus.   Class 1 obesity due to excess calories without serious comorbidity with body mass index (BMI) of 31.0 to 31.9 in adult Retry the [facility] coupon. She has already lost about 5 lbs getting on the treadmill 3 days per week at her apt complex. Congratulated her on changes and weight loss!    Time spent in encounter 21 minutes  I discussed the assessment and treatment plan with the patient. The patient was provided an opportunity to ask questions and all were answered. The patient agreed with the plan and demonstrated an understanding of the instructions.   The patient was advised to call back or seek an in-person evaluation if the symptoms worsen or if the condition fails to improve as anticipated.   , MD

## 2020-09-09 NOTE — Assessment & Plan Note (Signed)
Retry the [facility] coupon. She has already lost about 5 lbs getting on the treadmill 3 days per week at her apt complex. Congratulated her on changes and weight loss!

## 2020-09-09 NOTE — Assessment & Plan Note (Signed)
Doing well on Viibryd.  Has another 90 day refill. F/U in 4 mo.

## 2020-10-01 ENCOUNTER — Telehealth: Payer: Self-pay | Admitting: *Deleted

## 2020-10-01 NOTE — Telephone Encounter (Signed)
PA submitted through Cover My Meds for [facility] .25mg 

## 2020-10-03 MED FILL — WEGOVY 0.25 MG/0.5ML SOAJ: 0.25 | 28 days supply | Qty: 2 | Fill #0

## 2020-10-03 NOTE — Telephone Encounter (Signed)
Wegovy approved through [employer], effective fome 10/03/20-04/01/21. Pharmacy notified.

## 2020-10-27 ENCOUNTER — Encounter: Payer: Self-pay | Admitting: Family Medicine

## 2020-10-27 DIAGNOSIS — F909 Attention-deficit hyperactivity disorder, unspecified type: Secondary | ICD-10-CM

## 2020-11-04 MED ORDER — AMPHETAMINE-DEXTROAMPHET ER 25 MG PO CP24: 25.0000 mg | ORAL_CAPSULE | Freq: Every morning | ORAL | 0 refills | Status: DC

## 2020-11-11 ENCOUNTER — Other Ambulatory Visit: Payer: Self-pay | Admitting: Family Medicine

## 2020-11-11 DIAGNOSIS — F909 Attention-deficit hyperactivity disorder, unspecified type: Secondary | ICD-10-CM

## 2020-11-12 ENCOUNTER — Other Ambulatory Visit: Payer: Self-pay | Admitting: Family Medicine

## 2020-11-12 NOTE — Telephone Encounter (Signed)
Unable to refuse medication. Fwd to pcp to sign

## 2020-11-13 MED FILL — ADDERALL XR 25 MG CAPSULE: 25 | 30 days supply | Qty: 30 | Fill #0

## 2020-11-14 ENCOUNTER — Encounter: Payer: Self-pay | Admitting: Family Medicine

## 2020-12-12 ENCOUNTER — Other Ambulatory Visit (HOSPITAL_BASED_OUTPATIENT_CLINIC_OR_DEPARTMENT_OTHER): Payer: Self-pay

## 2020-12-12 MED FILL — Vilazodone HCl Tab 40 MG: ORAL | 90 days supply | Qty: 90 | Fill #0 | Status: AC

## 2020-12-19 ENCOUNTER — Encounter: Payer: Self-pay | Admitting: Family Medicine

## 2021-01-14 ENCOUNTER — Other Ambulatory Visit (HOSPITAL_BASED_OUTPATIENT_CLINIC_OR_DEPARTMENT_OTHER): Payer: Self-pay

## 2021-01-14 ENCOUNTER — Telehealth (INDEPENDENT_AMBULATORY_CARE_PROVIDER_SITE_OTHER): Payer: 59 | Admitting: Family Medicine

## 2021-01-14 ENCOUNTER — Encounter: Payer: Self-pay | Admitting: Family Medicine

## 2021-01-14 DIAGNOSIS — F332 Major depressive disorder, recurrent severe without psychotic features: Secondary | ICD-10-CM | POA: Diagnosis not present

## 2021-01-14 DIAGNOSIS — F909 Attention-deficit hyperactivity disorder, unspecified type: Secondary | ICD-10-CM | POA: Diagnosis not present

## 2021-01-14 MED ORDER — AMPHETAMINE-DEXTROAMPHET ER 25 MG PO CP24
25.0000 mg | ORAL_CAPSULE | Freq: Every morning | ORAL | 0 refills | Status: DC
  Filled 2021-01-14: qty 90, 90d supply, fill #0

## 2021-01-14 MED ORDER — VIIBRYD 20 MG PO TABS
1.0000 | ORAL_TABLET | Freq: Every day | ORAL | 1 refills | Status: DC
  Filled 2021-01-14 (×2): qty 30, 30d supply, fill #0

## 2021-01-14 NOTE — Assessment & Plan Note (Signed)
Gust options in regards to affecting her sex drive.  We can try a lower dose of Viibryd first for a month or 2 and see how she tolerates that.  But we can always consider switching.  She was previously on Effexor which did not affect her sex drive but did not seem to control her mood quite as well.  Follow-up in about 2 months.

## 2021-01-14 NOTE — Progress Notes (Signed)
Virtual Visit via Video Note  I connected with Anita Tanner on 01/14/21 at 11:10 AM EDT by a video enabled telemedicine application and verified that I am speaking with the correct person using two identifiers.   I discussed the limitations of evaluation and management by telemedicine and the availability of in person appointments. The patient expressed understanding and agreed to proceed.  Patient location: at home Provider location: in office  Subjective:    CC: ADHd  HPI: ADHD - Reports symptoms are well controlled on current regime. Denies any problems with insomnia, chest pain, palpitations, or SOB.  Increased her dose to 25 mg extended release Adderall in February.  F/U Magor depressive disorder-currently on Viibryd.  She says she actually has not been taking it consistently for the last couple of months and has noticed a major difference in her sex drive.  Is been effective for her mood.  Before being on Viibryd she was on Effexor.  She did not feel like the Effexor worked great for her mood but did feel like it did not affect her sex drive.  She has tried Wellbutrin in the past and felt like it was not helpful.  Is a couple of exciting things going on she actually just graduated from college a couple of weeks ago and she is actually getting married in about 3 weeks to her boyfriend of 4 years.   Past medical history, Surgical history, Family history not pertinant except as noted below, Social history, Allergies, and medications have been entered into the medical record, reviewed, and corrections made.    Objective:    General: Speaking clearly in complete sentences without any shortness of breath.  Alert and oriented x3.  Normal judgment. No apparent acute distress.    Impression and Recommendations:    MDD (major depressive disorder), recurrent severe, without psychosis Gust options in regards to affecting her sex drive.  We can try a lower dose of Viibryd first for  a month or 2 and see how she tolerates that.  But we can always consider switching.  She was previously on Effexor which did not affect her sex drive but did not seem to control her mood quite as well.  Follow-up in about 2 months.  ADHD (attention deficit hyperactivity disorder) Happy with current regimen.  Refills sent to pharmacy.  Denies any side effects on increased dose.    No orders of the defined types were placed in this encounter.   Meds ordered this encounter  Medications  . amphetamine-dextroamphetamine (ADDERALL XR) 25 MG 24 hr capsule    Sig: Take 1 capsule by mouth every morning.    Dispense:  90 capsule    Refill:  0  . Vilazodone HCl (VIIBRYD) 20 MG TABS    Sig: Take 1 tablet (20 mg total) by mouth daily.    Dispense:  30 tablet    Refill:  1     I discussed the assessment and treatment plan with the patient. The patient was provided an opportunity to ask questions and all were answered. The patient agreed with the plan and demonstrated an understanding of the instructions.   The patient was advised to call back or seek an in-person evaluation if the symptoms worsen or if the condition fails to improve as anticipated.   [name], MD

## 2021-01-14 NOTE — Assessment & Plan Note (Signed)
Happy with current regimen.  Refills sent to pharmacy.  Denies any side effects on increased dose.

## 2021-01-16 ENCOUNTER — Other Ambulatory Visit (HOSPITAL_BASED_OUTPATIENT_CLINIC_OR_DEPARTMENT_OTHER): Payer: Self-pay

## 2021-02-18 ENCOUNTER — Ambulatory Visit (INDEPENDENT_AMBULATORY_CARE_PROVIDER_SITE_OTHER): Payer: 59 | Admitting: Family Medicine

## 2021-02-18 ENCOUNTER — Other Ambulatory Visit (HOSPITAL_BASED_OUTPATIENT_CLINIC_OR_DEPARTMENT_OTHER): Payer: Self-pay

## 2021-02-18 ENCOUNTER — Encounter: Payer: Self-pay | Admitting: Family Medicine

## 2021-02-18 ENCOUNTER — Other Ambulatory Visit: Payer: Self-pay

## 2021-02-18 VITALS — BP 129/77 | HR 84 | Ht 63.0 in | Wt 166.0 lb

## 2021-02-18 DIAGNOSIS — S0081XA Abrasion of other part of head, initial encounter: Secondary | ICD-10-CM

## 2021-02-18 DIAGNOSIS — R6884 Jaw pain: Secondary | ICD-10-CM | POA: Diagnosis not present

## 2021-02-18 MED ORDER — MELOXICAM 15 MG PO TABS
15.0000 mg | ORAL_TABLET | Freq: Every day | ORAL | 0 refills | Status: DC
  Filled 2021-02-18: qty 30, 30d supply, fill #0
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
## 2021-05-20 ENCOUNTER — Encounter: Payer: Self-pay | Admitting: Family Medicine

## 2021-06-05 ENCOUNTER — Ambulatory Visit: Payer: 59 | Admitting: Family Medicine

## 2021-06-26 ENCOUNTER — Other Ambulatory Visit (HOSPITAL_BASED_OUTPATIENT_CLINIC_OR_DEPARTMENT_OTHER): Payer: Self-pay

## 2021-06-26 ENCOUNTER — Telehealth (INDEPENDENT_AMBULATORY_CARE_PROVIDER_SITE_OTHER): Payer: 59 | Admitting: Family Medicine

## 2021-06-26 ENCOUNTER — Encounter: Payer: Self-pay | Admitting: Family Medicine

## 2021-06-26 DIAGNOSIS — F32A Depression, unspecified: Secondary | ICD-10-CM

## 2021-06-26 DIAGNOSIS — F419 Anxiety disorder, unspecified: Secondary | ICD-10-CM | POA: Diagnosis not present

## 2021-06-26 DIAGNOSIS — F909 Attention-deficit hyperactivity disorder, unspecified type: Secondary | ICD-10-CM | POA: Diagnosis not present

## 2021-06-26 MED ORDER — FETZIMA 20 MG PO CP24
ORAL_CAPSULE | ORAL | 0 refills | Status: DC
  Filled 2021-06-26: qty 30, 16d supply, fill #0

## 2021-06-26 MED ORDER — AMPHETAMINE-DEXTROAMPHET ER 25 MG PO CP24
25.0000 mg | ORAL_CAPSULE | Freq: Every morning | ORAL | 0 refills | Status: DC
Start: 2021-06-26 — End: 2021-11-01
  Filled 2021-06-26: qty 90, 90d supply, fill #0

## 2021-06-26 MED ORDER — AMPHETAMINE-DEXTROAMPHETAMINE 5 MG PO TABS
5.0000 mg | ORAL_TABLET | Freq: Every day | ORAL | 0 refills | Status: DC
  Filled 2021-06-26: qty 30, 30d supply, fill #0

## 2021-06-26 NOTE — Progress Notes (Signed)
Not taking Viibryd 20 mg she reports it was making her sick, she would vomit.

## 2021-06-26 NOTE — Assessment & Plan Note (Signed)
Continue current regimen. Discuss prn use of short aciting Adderall prn in late afternoon but be mindful of giving the medication time to wear off.

## 2021-06-26 NOTE — Progress Notes (Signed)
Virtual Visit via Video Note  I connected with Anita Tanner on 06/26/21 at  1:00 PM EDT by a video enabled telemedicine application and verified that I am speaking with the correct person using two identifiers.   I discussed the limitations of evaluation and management by telemedicine and the availability of in person appointments. The patient expressed understanding and agreed to proceed.  Patient location: at home Provider location: in office  Subjective:    CC: ADHD  HPI:  ADHD - Reports symptoms are well controlled on current regime. Denies any problems with insomnia, chest pain, palpitations, or SOB.  Feels like wearing off in the early evening.  She feels like in the evenings she is having a hard time focusing.    Would vomit several hours after Viibryd. D/C's mediction. Wasn't able to make an appt. So not on anything currently. She feels her low mood has been more persistent,    Happy with work but still trying to figure out what she wants to do.    Past medical history, Surgical history, Family history not pertinant except as noted below, Social history, Allergies, and medications have been entered into the medical record, reviewed, and corrections made.    Objective:    General: Speaking clearly in complete sentences without any shortness of breath.  Alert and oriented x3.  Normal judgment. No apparent acute distress.    Impression and Recommendations:    ADHD (attention deficit hyperactivity disorder) Continue current regimen. Discuss prn use of short aciting Adderall prn in late afternoon but be mindful of giving the medication time to wear off.    Anxiety and depression Discussed options.  Discussed options.  Will try Fetzima.  F/U in 6 weeks.     No orders of the defined types were placed in this encounter.   Meds ordered this encounter  Medications   Levomilnacipran HCl ER (FETZIMA) 20 MG CP24    Sig: Take 1 capsule by mouth daily for 2 days, THEN 2  capsules daily for 28 days.    Dispense:  30 capsule    Refill:  0   amphetamine-dextroamphetamine (ADDERALL XR) 25 MG 24 hr capsule    Sig: Take 1 capsule by mouth every morning.    Dispense:  90 capsule    Refill:  0   amphetamine-dextroamphetamine (ADDERALL) 5 MG tablet    Sig: Take 1 tablet (5 mg total) by mouth daily in the afternoon.    Dispense:  30 tablet    Refill:  0     I discussed the assessment and treatment plan with the patient. The patient was provided an opportunity to ask questions and all were answered. The patient agreed with the plan and demonstrated an understanding of the instructions.   The patient was advised to call back or seek an in-person evaluation if the symptoms worsen or if the condition fails to improve as anticipated.   [name], MD

## 2021-06-26 NOTE — Assessment & Plan Note (Signed)
Discussed options.  Discussed options.  Will try Fetzima.  F/U in 6 weeks.

## 2021-06-27 ENCOUNTER — Other Ambulatory Visit: Payer: Self-pay | Admitting: *Deleted

## 2021-06-27 ENCOUNTER — Other Ambulatory Visit: Payer: Self-pay | Admitting: Family Medicine

## 2021-06-27 ENCOUNTER — Other Ambulatory Visit (HOSPITAL_BASED_OUTPATIENT_CLINIC_OR_DEPARTMENT_OTHER): Payer: Self-pay

## 2021-06-27 ENCOUNTER — Telehealth: Payer: Self-pay

## 2021-06-27 MED ORDER — FETZIMA 20 MG PO CP24
1.0000 | ORAL_CAPSULE | Freq: Every day | ORAL | 0 refills | Status: AC
  Filled 2021-06-27: qty 2, 2d supply, fill #0

## 2021-06-27 MED ORDER — FETZIMA 40 MG PO CP24
40.0000 mg | ORAL_CAPSULE | Freq: Every day | ORAL | 1 refills | Status: DC
  Filled 2021-06-27: qty 30, 30d supply, fill #0

## 2021-06-27 NOTE — Telephone Encounter (Signed)
Submitted PA for Fetzima through CoverMyMeds.

## 2021-06-27 NOTE — Telephone Encounter (Signed)
Anita Tanner w/[facility] pharmacy called and stated that although her prescription has been approved it is only for a 2 week supply. He said that they will resubmit the PA for it to be for her to take 2 caps daily and will need the quantity to be #58 for a 30 day supply.  New rx started and pended will fwd for review and signature to pcp

## 2021-06-27 NOTE — Telephone Encounter (Signed)
PA has been approved. Pharmacy aware. Left message for patient about approval and that the pharmacy will have to order the medication and it will be there on Monday.

## 2021-06-27 NOTE — Telephone Encounter (Signed)
Medication: Levomilnacipran HCl ER (FETZIMA) 40 MG CP24 Prior authorization submitted via CoverMyMeds on 06/27/2021 PA submission pending

## 2021-06-30 ENCOUNTER — Other Ambulatory Visit (HOSPITAL_BASED_OUTPATIENT_CLINIC_OR_DEPARTMENT_OTHER): Payer: Self-pay

## 2021-09-14 ENCOUNTER — Encounter: Payer: Self-pay | Admitting: Family Medicine

## 2021-10-08 ENCOUNTER — Other Ambulatory Visit (HOSPITAL_BASED_OUTPATIENT_CLINIC_OR_DEPARTMENT_OTHER): Payer: Self-pay

## 2021-10-08 DIAGNOSIS — Z6825 Body mass index (BMI) 25.0-25.9, adult: Secondary | ICD-10-CM | POA: Diagnosis not present

## 2021-10-08 DIAGNOSIS — Z124 Encounter for screening for malignant neoplasm of cervix: Secondary | ICD-10-CM | POA: Diagnosis not present

## 2021-10-08 DIAGNOSIS — Z118 Encounter for screening for other infectious and parasitic diseases: Secondary | ICD-10-CM | POA: Diagnosis not present

## 2021-10-08 DIAGNOSIS — N760 Acute vaginitis: Secondary | ICD-10-CM | POA: Diagnosis not present

## 2021-10-08 DIAGNOSIS — Z1159 Encounter for screening for other viral diseases: Secondary | ICD-10-CM | POA: Diagnosis not present

## 2021-10-08 DIAGNOSIS — Z01411 Encounter for gynecological examination (general) (routine) with abnormal findings: Secondary | ICD-10-CM | POA: Diagnosis not present

## 2021-10-08 DIAGNOSIS — N898 Other specified noninflammatory disorders of vagina: Secondary | ICD-10-CM | POA: Diagnosis not present

## 2021-10-08 DIAGNOSIS — Z113 Encounter for screening for infections with a predominantly sexual mode of transmission: Secondary | ICD-10-CM | POA: Diagnosis not present

## 2021-10-08 DIAGNOSIS — Z114 Encounter for screening for human immunodeficiency virus [HIV]: Secondary | ICD-10-CM | POA: Diagnosis not present

## 2021-10-08 DIAGNOSIS — Z01419 Encounter for gynecological examination (general) (routine) without abnormal findings: Secondary | ICD-10-CM | POA: Diagnosis not present

## 2021-10-08 LAB — HM PAP SMEAR: HM Pap smear: NEGATIVE

## 2021-10-08 MED ORDER — METRONIDAZOLE 500 MG PO TABS
500.0000 mg | ORAL_TABLET | Freq: Two times a day (BID) | ORAL | 0 refills | Status: DC
  Filled 2021-10-08: qty 14, 7d supply, fill #0

## 2021-10-09 ENCOUNTER — Other Ambulatory Visit (HOSPITAL_BASED_OUTPATIENT_CLINIC_OR_DEPARTMENT_OTHER): Payer: Self-pay

## 2021-11-01 ENCOUNTER — Other Ambulatory Visit: Payer: Self-pay | Admitting: Family Medicine

## 2021-11-01 DIAGNOSIS — F909 Attention-deficit hyperactivity disorder, unspecified type: Secondary | ICD-10-CM

## 2021-11-03 ENCOUNTER — Other Ambulatory Visit (HOSPITAL_BASED_OUTPATIENT_CLINIC_OR_DEPARTMENT_OTHER): Payer: Self-pay

## 2021-11-03 ENCOUNTER — Encounter: Payer: Self-pay | Admitting: Family Medicine

## 2021-11-03 MED ORDER — AMPHETAMINE-DEXTROAMPHET ER 25 MG PO CP24
25.0000 mg | ORAL_CAPSULE | Freq: Every morning | ORAL | 0 refills | Status: DC
  Filled 2021-11-03: qty 90, 90d supply, fill #0

## 2021-11-03 MED ORDER — LIDOCAINE-PRILOCAINE 2.5-2.5 % EX CREA: 1.0000 "application " | TOPICAL_CREAM | CUTANEOUS | 0 refills | Status: DC | PRN

## 2021-11-03 NOTE — Telephone Encounter (Signed)
Meds ordered this encounter  ?Medications  ? lidocaine-prilocaine (EMLA) cream  ?  Sig: Apply 1 application. topically as needed. Apply 30 minutes befoe procedure.  ?  Dispense:  30 g  ?  Refill:  0  ? ? ?

## 2021-11-11 ENCOUNTER — Telehealth: Payer: 59 | Admitting: Family Medicine

## 2021-11-14 ENCOUNTER — Other Ambulatory Visit (HOSPITAL_BASED_OUTPATIENT_CLINIC_OR_DEPARTMENT_OTHER): Payer: Self-pay

## 2021-11-14 ENCOUNTER — Other Ambulatory Visit: Payer: Self-pay

## 2021-11-14 ENCOUNTER — Emergency Department
Admission: RE | Admit: 2021-11-14 | Discharge: 2021-11-14 | Disposition: A | Discharge status: Home / Self Care | Payer: 59 | Source: Ambulatory Visit

## 2021-11-14 VITALS — BP 129/80 | HR 75 | Temp 98.2°F | Resp 17

## 2021-11-14 DIAGNOSIS — S161XXA Strain of muscle, fascia and tendon at neck level, initial encounter: Secondary | ICD-10-CM | POA: Diagnosis not present

## 2021-11-14 DIAGNOSIS — R519 Headache, unspecified: Secondary | ICD-10-CM

## 2021-11-14 MED ORDER — METHYLPREDNISOLONE 4 MG PO TBPK
ORAL_TABLET | ORAL | 0 refills | Status: DC
  Filled 2021-11-14: qty 21, 6d supply, fill #0

## 2021-11-14 MED ORDER — BACLOFEN 10 MG PO TABS
10.0000 mg | ORAL_TABLET | Freq: Three times a day (TID) | ORAL | 0 refills | Status: DC
  Filled 2021-11-14: qty 30, 10d supply, fill #0
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
## 2021-11-14 NOTE — Discharge Instructions (Addendum)
Advised/encouraged patient may use OTC Excedrin 1-2 tabs daily, as needed for headache.  Advised patient to take medication as directed with food to completion.  Advised patient may take Baclofen daily or as needed for accompanying muscle spasms of neck.  Instructed patient to start Medrol Dosepak tomorrow morning, Saturday, 11/15/2021.  Encouraged patient to increase daily water intake while taking these medications.  Advised patient if symptoms worsen and/or unresolved, please follow-up PCP or here for further evaluation. ?

## 2022-02-25 ENCOUNTER — Other Ambulatory Visit (HOSPITAL_BASED_OUTPATIENT_CLINIC_OR_DEPARTMENT_OTHER): Payer: Self-pay

## 2022-02-25 ENCOUNTER — Other Ambulatory Visit: Payer: Self-pay | Admitting: Family Medicine

## 2022-02-25 DIAGNOSIS — F909 Attention-deficit hyperactivity disorder, unspecified type: Secondary | ICD-10-CM

## 2022-02-25 MED ORDER — AMPHETAMINE-DEXTROAMPHET ER 25 MG PO CP24
25.0000 mg | ORAL_CAPSULE | Freq: Every morning | ORAL | 0 refills | Status: DC
Start: 2022-02-25 — End: 2022-04-13
  Filled 2022-02-25: qty 30, 30d supply, fill #0

## 2022-02-25 NOTE — Telephone Encounter (Signed)
Mychart message sent to patient to schedule a follow up appointment for further refills.

## 2022-02-27 ENCOUNTER — Other Ambulatory Visit (HOSPITAL_BASED_OUTPATIENT_CLINIC_OR_DEPARTMENT_OTHER): Payer: Self-pay

## 2022-04-01 ENCOUNTER — Encounter (INDEPENDENT_AMBULATORY_CARE_PROVIDER_SITE_OTHER): Payer: Self-pay

## 2022-04-10 ENCOUNTER — Encounter: Payer: Self-pay | Admitting: Family Medicine

## 2022-04-10 ENCOUNTER — Telehealth: Payer: Self-pay

## 2022-04-10 ENCOUNTER — Other Ambulatory Visit (HOSPITAL_BASED_OUTPATIENT_CLINIC_OR_DEPARTMENT_OTHER): Payer: Self-pay

## 2022-04-10 ENCOUNTER — Telehealth (INDEPENDENT_AMBULATORY_CARE_PROVIDER_SITE_OTHER): Payer: 59 | Admitting: Family Medicine

## 2022-04-10 DIAGNOSIS — F419 Anxiety disorder, unspecified: Secondary | ICD-10-CM | POA: Diagnosis not present

## 2022-04-10 DIAGNOSIS — F909 Attention-deficit hyperactivity disorder, unspecified type: Secondary | ICD-10-CM

## 2022-04-10 DIAGNOSIS — F32A Depression, unspecified: Secondary | ICD-10-CM | POA: Diagnosis not present

## 2022-04-10 MED ORDER — AMPHETAMINE-DEXTROAMPHET ER 25 MG PO CP24
25.0000 mg | ORAL_CAPSULE | Freq: Every morning | ORAL | 0 refills | Status: DC
  Filled 2022-06-08 – 2022-08-11 (×3): qty 90, 90d supply, fill #0

## 2022-04-10 MED ORDER — AMPHETAMINE-DEXTROAMPHETAMINE 5 MG PO TABS: 5.0000 mg | ORAL_TABLET | Freq: Every day | ORAL | 0 refills | Status: DC

## 2022-04-10 NOTE — Progress Notes (Signed)
Virtual Visit via Video Note  I connected with Anita Tanner on 04/10/22 at  1:00 PM EDT by a video enabled telemedicine application and verified that I am speaking with the correct person using two identifiers.   I discussed the limitations of evaluation and management by telemedicine and the availability of in person appointments. The patient expressed understanding and agreed to proceed.  Patient location: at work Provider location: in office  Subjective:    CC:  No chief complaint on file.   HPI: ADD - Reports symptoms are well controlled on current regime. Denies any problems with insomnia, chest pain, palpitations, or SOB.    F/U Anxiety and Depression - she stopped taking the [facility] and was worried about weight gain.   She has been working out and going to the gym and trying to put focus on herself. She has purposely lost weight.   She did get a new a dog. Trying to  build a better relationship with her body. Sleep is fair with the new dog.  Trying to go to bed earlier.  About 7 hours.   Went to gyn for her pap smear.   Past medical history, Surgical history, Family history not pertinant except as noted below, Social history, Allergies, and medications have been entered into the medical record, reviewed, and corrections made.    Objective:    General: Speaking clearly in complete sentences without any shortness of breath.  Alert and oriented x3.  Normal judgment. No apparent acute distress.    Impression and Recommendations:    Problem List Items Addressed This Visit       Other   Anxiety and depression - Primary    She did not necessarily have side effects on the medication but just decided to stop it.  She feels like right now emotionally she is doing okay and she is really been putting a lot of time and effort into self-care and exercise and feels like it really has made a difference.  Just reminded her to make sure that she is taking some time to do some  either meditation or positive thinking during the day to really also help elevate her mood she is also been really working on consistent sleep quality which is fantastic.  If she is otherwise doing well then like to see her back in 6 months.      ADHD (attention deficit hyperactivity disorder)   Relevant Medications   amphetamine-dextroamphetamine (ADDERALL XR) 25 MG 24 hr capsule (Start on 06/08/2022)   amphetamine-dextroamphetamine (ADDERALL) 5 MG tablet (Start on 06/08/2022)    No orders of the defined types were placed in this encounter.   Meds ordered this encounter  Medications   amphetamine-dextroamphetamine (ADDERALL XR) 25 MG 24 hr capsule    Sig: Take 1 capsule by mouth every morning.    Dispense:  90 capsule    Refill:  0   amphetamine-dextroamphetamine (ADDERALL) 5 MG tablet    Sig: Take 1 tablet (5 mg total) by mouth daily in the afternoon.    Dispense:  90 tablet    Refill:  0     I discussed the assessment and treatment plan with the patient. The patient was provided an opportunity to ask questions and all were answered. The patient agreed with the plan and demonstrated an understanding of the instructions.   The patient was advised to call back or seek an in-person evaluation if the symptoms worsen or if the condition fails to improve  as anticipated.   Beatrice Lecher, MD

## 2022-04-10 NOTE — Assessment & Plan Note (Signed)
She did not necessarily have side effects on the medication but just decided to stop it.  She feels like right now emotionally she is doing okay and she is really been putting a lot of time and effort into self-care and exercise and feels like it really has made a difference.  Just reminded her to make sure that she is taking some time to do some either meditation or positive thinking during the day to really also help elevate her mood she is also been really working on consistent sleep quality which is fantastic.  If she is otherwise doing well then like to see her back in 6 months.

## 2022-04-10 NOTE — Telephone Encounter (Signed)
Left detailed vm for medical record the office of  Cassandra Law, D.O. over at [facility] for most updated pap Tel [phone]

## 2022-04-13 ENCOUNTER — Other Ambulatory Visit (HOSPITAL_BASED_OUTPATIENT_CLINIC_OR_DEPARTMENT_OTHER): Payer: Self-pay

## 2022-04-13 ENCOUNTER — Telehealth: Payer: Self-pay | Admitting: Family Medicine

## 2022-04-13 DIAGNOSIS — F909 Attention-deficit hyperactivity disorder, unspecified type: Secondary | ICD-10-CM

## 2022-04-13 MED ORDER — AMPHETAMINE-DEXTROAMPHET ER 25 MG PO CP24
25.0000 mg | ORAL_CAPSULE | Freq: Every morning | ORAL | 0 refills | Status: DC
  Filled 2022-04-13: qty 90, 90d supply, fill #0

## 2022-04-13 MED ORDER — AMPHETAMINE-DEXTROAMPHETAMINE 10 MG PO TABS
5.0000 mg | ORAL_TABLET | Freq: Every day | ORAL | 0 refills | Status: DC
  Filled 2022-04-13: qty 45, 90d supply, fill #0

## 2022-04-13 NOTE — Telephone Encounter (Signed)
Meds ordered this encounter  Medications   amphetamine-dextroamphetamine (ADDERALL XR) 25 MG 24 hr capsule    Sig: Take 1 capsule by mouth every morning.    Dispense:  90 capsule    Refill:  0   amphetamine-dextroamphetamine (ADDERALL) 5 MG tablet    Sig: Take 1 tablet (5 mg total) by mouth daily in the afternoon.    Dispense:  90 tablet    Refill:  0

## 2022-04-14 ENCOUNTER — Other Ambulatory Visit (HOSPITAL_BASED_OUTPATIENT_CLINIC_OR_DEPARTMENT_OTHER): Payer: Self-pay

## 2022-06-08 ENCOUNTER — Other Ambulatory Visit (HOSPITAL_BASED_OUTPATIENT_CLINIC_OR_DEPARTMENT_OTHER): Payer: Self-pay

## 2022-06-10 ENCOUNTER — Telehealth: Payer: Self-pay

## 2022-06-10 NOTE — Telephone Encounter (Signed)
Okay to schedule virtual appointment so that we can talk about the IUD placement.  And then after that if she is wanting to move forward then we will get her scheduled.

## 2022-06-10 NOTE — Telephone Encounter (Signed)
Please contact the patient to schedule a virtual appt to discuss birth control options. Thanks in advance.

## 2022-06-11 NOTE — Telephone Encounter (Signed)
Pt has an appt scheduled w/J. Charna Archer 11/14

## 2022-07-06 NOTE — Progress Notes (Unsigned)
   Established Patient Office Visit  Subjective   Patient ID: Anita Tanner, female   DOB: 2001-03-22 Age: 21 y.o. MRN: [id number]   No chief complaint on file.  HPI    Objective:    There were no vitals filed for this visit.  Physical Exam   No results found for this or any previous visit (from the past 24 hour(s)).   {Labs (Optional):23779}  The ASCVD Risk score (Arnett DK, et al., 2019) failed to calculate for the following reasons:   The 2019 ASCVD risk score is only valid for ages 50 to 75   Assessment & Plan:   No problem-specific Assessment & Plan notes found for this encounter.   No follow-ups on file.  ___________________________________________ Thayer Ohm, DNP, APRN, FNP-BC Primary Care and Sports Medicine [facility]

## 2022-07-07 ENCOUNTER — Ambulatory Visit: Payer: 59 | Admitting: Medical-Surgical

## 2022-07-07 ENCOUNTER — Encounter: Payer: Self-pay | Admitting: Medical-Surgical

## 2022-07-07 VITALS — BP 124/82 | HR 84 | Resp 20 | Ht 63.0 in | Wt 137.0 lb

## 2022-07-07 DIAGNOSIS — Z113 Encounter for screening for infections with a predominantly sexual mode of transmission: Secondary | ICD-10-CM

## 2022-07-07 DIAGNOSIS — N898 Other specified noninflammatory disorders of vagina: Secondary | ICD-10-CM

## 2022-07-07 DIAGNOSIS — R8290 Unspecified abnormal findings in urine: Secondary | ICD-10-CM | POA: Diagnosis not present

## 2022-07-07 DIAGNOSIS — Z23 Encounter for immunization: Secondary | ICD-10-CM | POA: Diagnosis not present

## 2022-07-07 DIAGNOSIS — Z30433 Encounter for removal and reinsertion of intrauterine contraceptive device: Secondary | ICD-10-CM

## 2022-07-07 LAB — POCT URINALYSIS DIP (CLINITEK)
Blood, UA: NEGATIVE
Glucose, UA: NEGATIVE mg/dL
Nitrite, UA: NEGATIVE
Spec Grav, UA: 1.025 (ref 1.010–1.025)
Urobilinogen, UA: 1 E.U./dL
pH, UA: 6 (ref 5.0–8.0)

## 2022-07-07 LAB — WET PREP FOR TRICH, YEAST, CLUE
MICRO NUMBER:: 14186575
Specimen Quality: ADEQUATE

## 2022-07-07 LAB — POCT URINE PREGNANCY: Preg Test, Ur: NEGATIVE

## 2022-07-07 MED ORDER — LEVONORGESTREL 19.5 MG IU IUD: 1.0000 | INTRAUTERINE_SYSTEM | Freq: Once | INTRAUTERINE | 0 refills | Status: AC

## 2022-07-08 LAB — SURESWAB CT/NG/T. VAGINALIS
C. trachomatis RNA, TMA: NOT DETECTED
N. gonorrhoeae RNA, TMA: NOT DETECTED
Trichomonas vaginalis RNA: NOT DETECTED

## 2022-07-09 LAB — URINE CULTURE
MICRO NUMBER:: 14192004
Result:: NO GROWTH
SPECIMEN QUALITY:: ADEQUATE

## 2022-07-23 ENCOUNTER — Other Ambulatory Visit (HOSPITAL_BASED_OUTPATIENT_CLINIC_OR_DEPARTMENT_OTHER): Payer: Self-pay

## 2022-07-31 ENCOUNTER — Other Ambulatory Visit (HOSPITAL_BASED_OUTPATIENT_CLINIC_OR_DEPARTMENT_OTHER): Payer: Self-pay

## 2022-08-11 ENCOUNTER — Other Ambulatory Visit (HOSPITAL_BASED_OUTPATIENT_CLINIC_OR_DEPARTMENT_OTHER): Payer: Self-pay

## 2022-08-18 ENCOUNTER — Ambulatory Visit: Payer: 59 | Admitting: Medical-Surgical

## 2022-08-18 ENCOUNTER — Ambulatory Visit (INDEPENDENT_AMBULATORY_CARE_PROVIDER_SITE_OTHER): Payer: 59

## 2022-08-18 ENCOUNTER — Encounter: Payer: Self-pay | Admitting: Medical-Surgical

## 2022-08-18 VITALS — BP 108/70 | HR 85 | Resp 20 | Ht 63.0 in | Wt 144.0 lb

## 2022-08-18 DIAGNOSIS — R109 Unspecified abdominal pain: Secondary | ICD-10-CM

## 2022-08-18 DIAGNOSIS — Z30431 Encounter for routine checking of intrauterine contraceptive device: Secondary | ICD-10-CM

## 2022-08-18 DIAGNOSIS — N920 Excessive and frequent menstruation with regular cycle: Secondary | ICD-10-CM

## 2022-08-18 DIAGNOSIS — N83209 Unspecified ovarian cyst, unspecified side: Secondary | ICD-10-CM | POA: Diagnosis not present

## 2022-08-18 DIAGNOSIS — N939 Abnormal uterine and vaginal bleeding, unspecified: Secondary | ICD-10-CM | POA: Diagnosis not present

## 2022-08-18 NOTE — Progress Notes (Signed)
   Established Patient Office Visit  Subjective   Patient ID: Anita Tanner, female   DOB: 2001-05-14 Age: 21 y.o. MRN: [id number]   Chief Complaint  Patient presents with   Follow-up    IUD STRING CHECK    HPI Pleasant 21 year old female presenting today for an IUD string check.  Had her IUD placed approximately 6 weeks ago.  Since then, she has noted more frequent spotting as well as some cramping.  She did have a menstrual cycle that lasted approximately 7 days but was very light.  Notes that she and her husband had intercourse approximately 2 weeks ago he was able to feel the strings similar to the last time she had an IUD placed.   Objective:    Vitals:   08/18/22 0932  BP: 108/70  Pulse: 85  Resp: 20  Height: 5\' 3"  (1.6 m)  Weight: 144 lb 0.6 oz (65.3 kg)  SpO2: 100%  BMI (Calculated): 25.52    Physical Exam Vitals and nursing note reviewed.  Constitutional:      General: She is not in acute distress.    Appearance: Normal appearance. She is not ill-appearing.  HENT:     Head: Normocephalic and atraumatic.  Cardiovascular:     Rate and Rhythm: Normal rate and regular rhythm.  Pulmonary:     Effort: Pulmonary effort is normal. No respiratory distress.  Skin:    General: Skin is warm and dry.  Neurological:     Mental Status: She is alert and oriented to person, place, and time.  Psychiatric:        Mood and Affect: Mood normal.        Behavior: Behavior normal.        Thought Content: Thought content normal.        Judgment: Judgment normal.   No results found for this or any previous visit (from the past 24 hour(s)).     The ASCVD Risk score (Arnett DK, et al., 2019) failed to calculate for the following reasons:   The 2019 ASCVD risk score is only valid for ages 17 to 75   Assessment & Plan:   1. IUD check up Spotting and cramping is not unusual in the setting of a recent IUD removal and replacement.  Strings are visualized inside the cervical os  so suspect that the IUD may have moved a bit.  Reassured that her husband should not be able to feel the strings during intercourse.  For reassurance and verification, ordering a pelvic ultrasound for further investigation. - 76 Pelvic Complete With Transvaginal; Future  Return if symptoms worsen or fail to improve.  ___________________________________________ [place], DNP, APRN, FNP-BC Primary Care and Sports Medicine [facility] [place] [facility]

## 2022-10-27 IMAGING — US US ABDOMEN LIMITED
1 series · 14 of 25 positions shown · non-contrast
Comparison: None.

CLINICAL DATA: RIGHT upper quadrant pain and tenderness

EXAM:
ULTRASOUND ABDOMEN LIMITED RIGHT UPPER QUADRANT

[Series 1: us abdomen limited · 0.15mm/px · 14 of 54 slices shown]
[im 1/54]
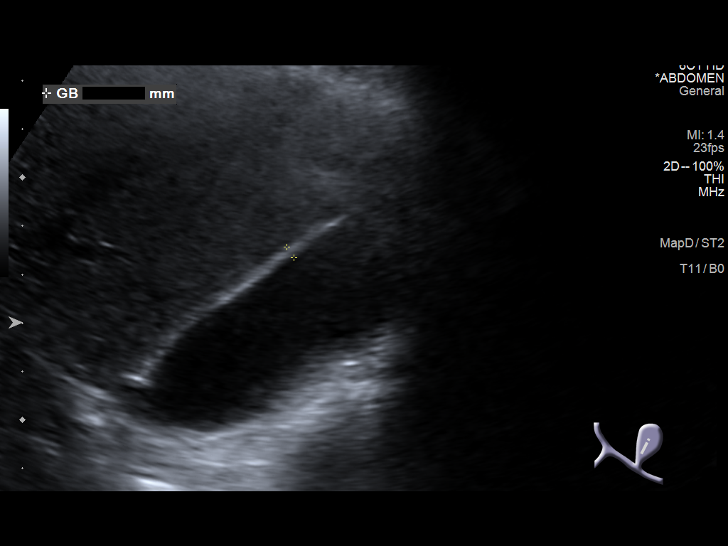
[im 5/54]
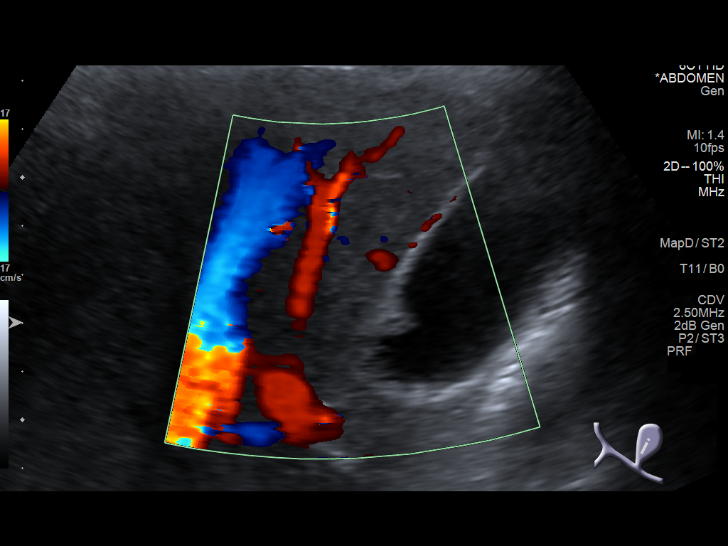
[im 9/54]
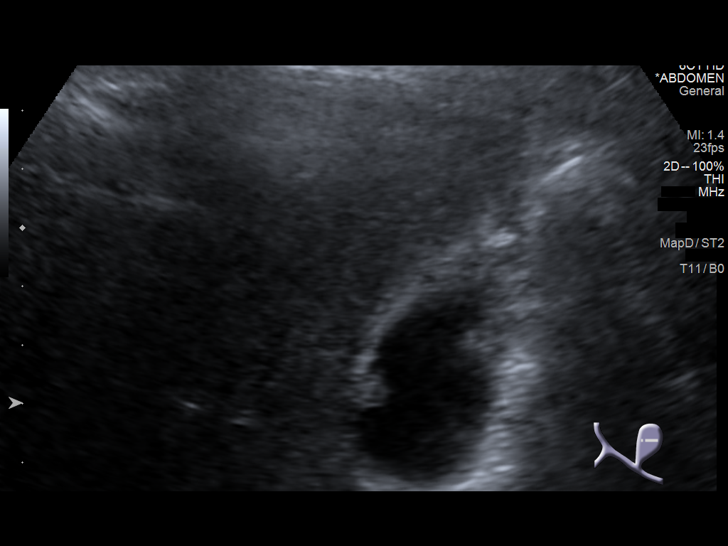
[im 14/54]
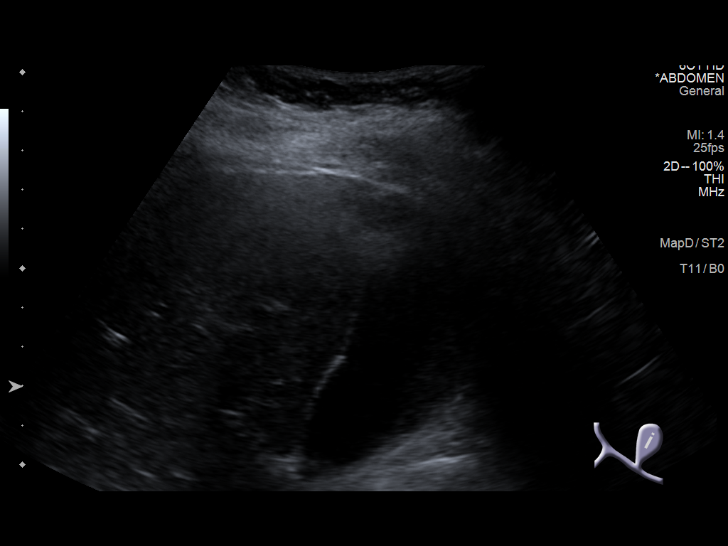
[im 18/54]
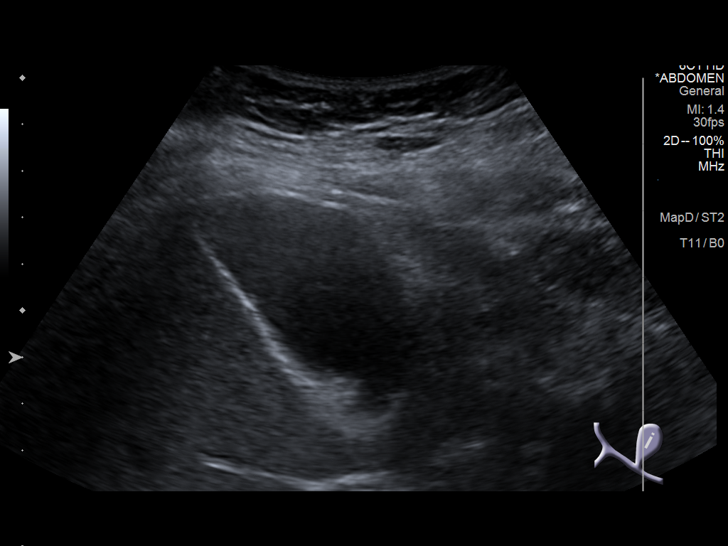
[im 20/54]
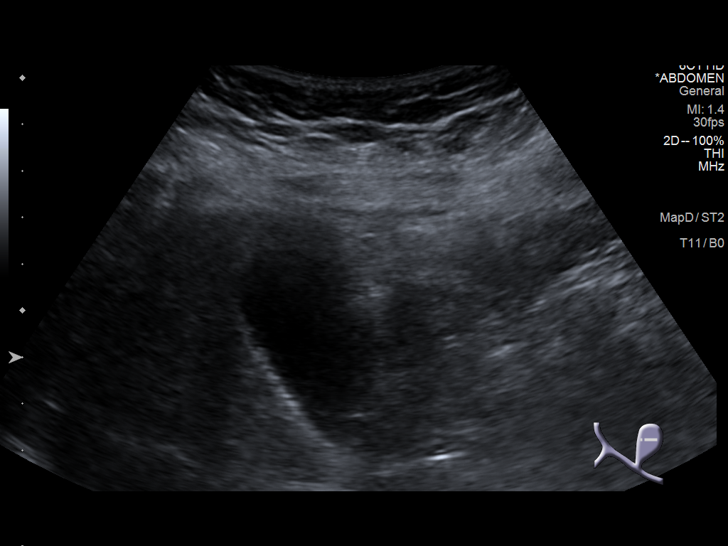
[im 25/54]
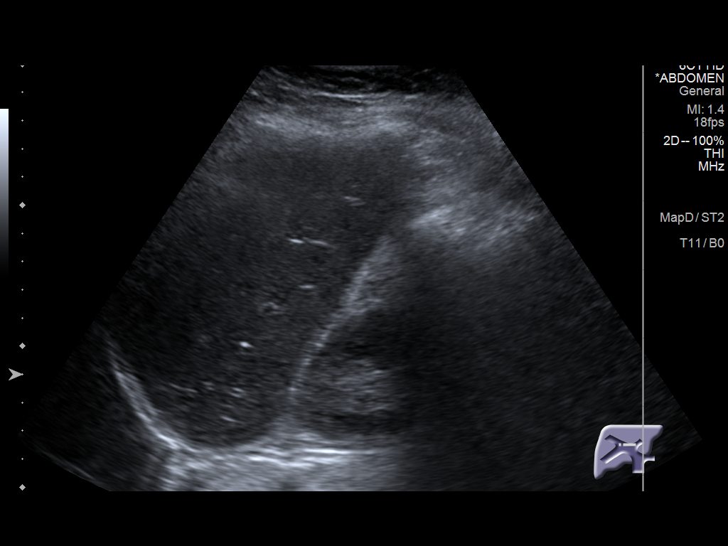
[im 29/54]
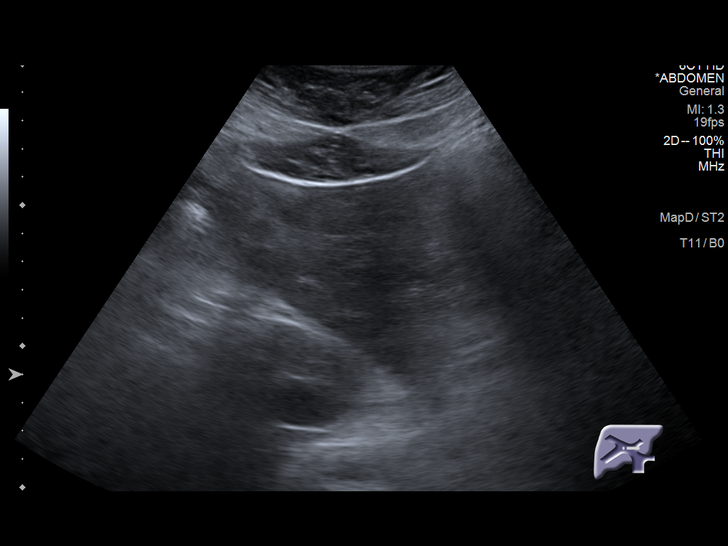
[im 34/54]
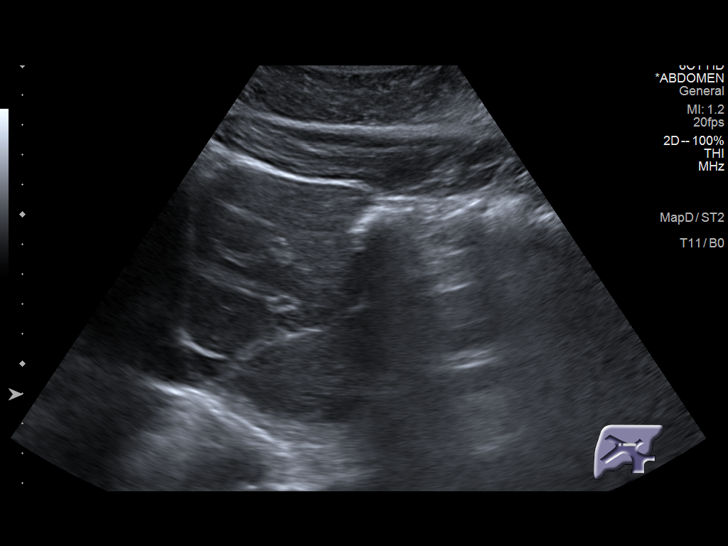
[im 36/54]
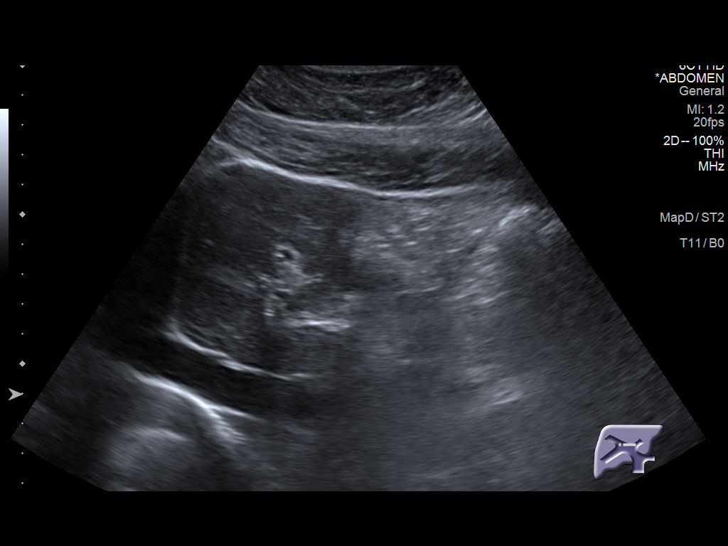
[im 40/54]
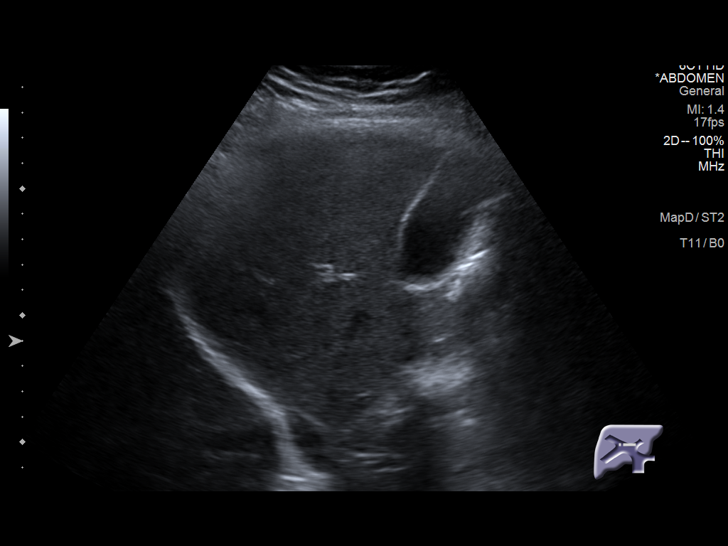
[im 45/54]
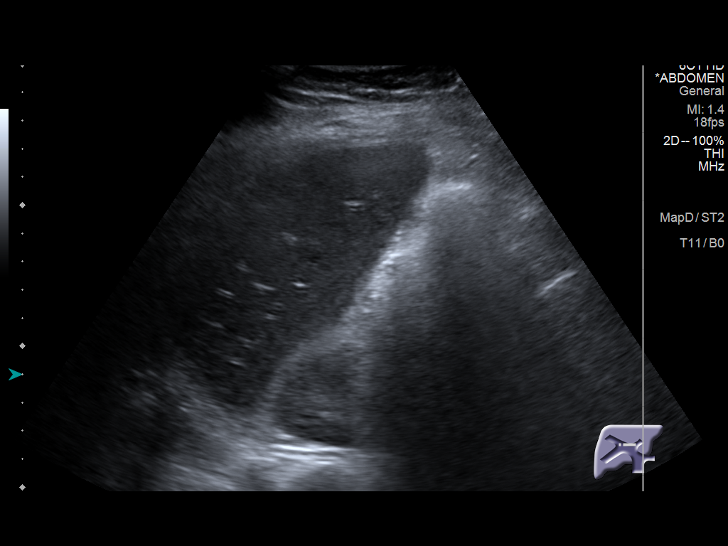
[im 49/54]
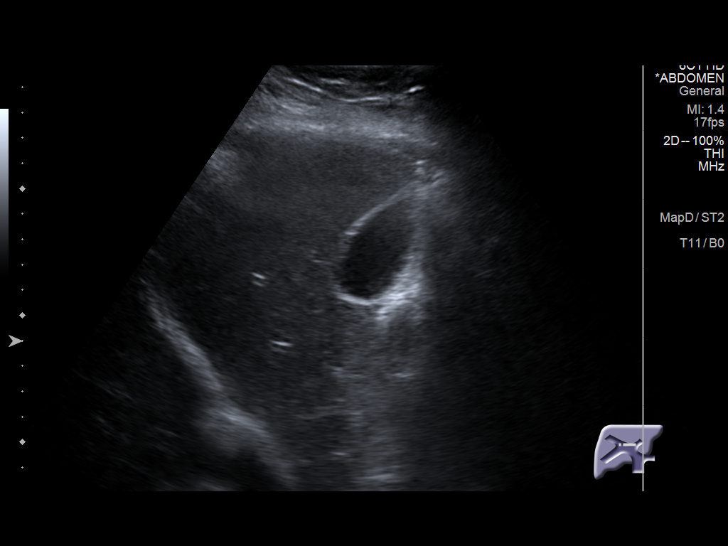
[im 54/54]
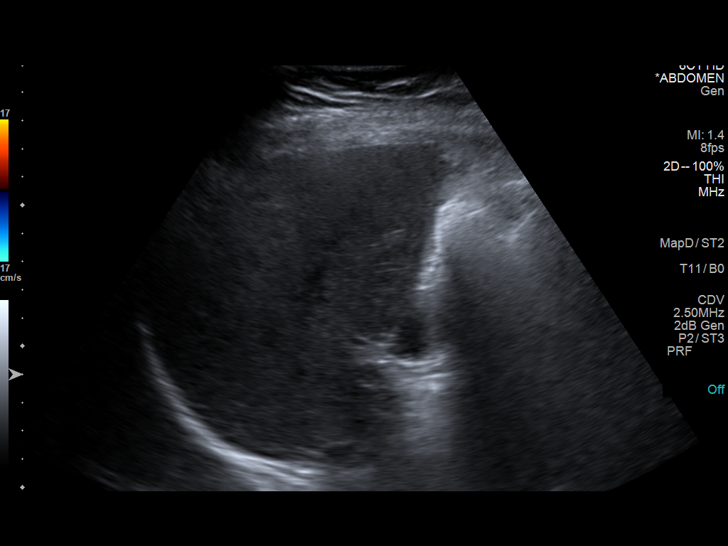

[14 of 25 positions shown; findings below may reference images not displayed]

FINDINGS: Gallbladder:

Non mobile non shadowing wall adherent nodule in the gallbladder
measures 7 mm. No gallbladder distension. No echogenic calculi.
Negative sonographic Murphy's sign.

Common bile duct:

Diameter: Not identified

Liver:

No focal lesion identified. Within normal limits in parenchymal
echogenicity. Portal vein is patent on color Doppler imaging with
normal direction of blood flow towards the liver.

Other: None.
IMPRESSION: 1. No evidence acute cholecystitis.
2. Probable benign polyp along the gallbladder wall. Per consensus
criteria recommend follow-up ultrasound in 1 year. This
recommendation follows ACR consensus guidelines: White Paper of the
ACR Incidental Findings Committee II on Gallbladder and Biliary
Findings. [HOSPITAL] 0434:;[DATE].
3. Normal liver.  No biliary duct dilatation.

## 2022-12-01 ENCOUNTER — Encounter: Payer: Self-pay | Admitting: Family Medicine

## 2022-12-01 NOTE — Telephone Encounter (Signed)
Patient scheduled for a virtual visit  °

## 2022-12-08 ENCOUNTER — Encounter: Payer: Self-pay | Admitting: Family Medicine

## 2022-12-08 ENCOUNTER — Telehealth (INDEPENDENT_AMBULATORY_CARE_PROVIDER_SITE_OTHER): Payer: Commercial Managed Care - PPO | Admitting: Family Medicine

## 2022-12-08 ENCOUNTER — Other Ambulatory Visit (HOSPITAL_BASED_OUTPATIENT_CLINIC_OR_DEPARTMENT_OTHER): Payer: Self-pay

## 2022-12-08 DIAGNOSIS — F909 Attention-deficit hyperactivity disorder, unspecified type: Secondary | ICD-10-CM | POA: Diagnosis not present

## 2022-12-08 DIAGNOSIS — F419 Anxiety disorder, unspecified: Secondary | ICD-10-CM | POA: Diagnosis not present

## 2022-12-08 DIAGNOSIS — F32A Depression, unspecified: Secondary | ICD-10-CM | POA: Diagnosis not present

## 2022-12-08 DIAGNOSIS — Z309 Encounter for contraceptive management, unspecified: Secondary | ICD-10-CM

## 2022-12-08 MED ORDER — AMPHETAMINE-DEXTROAMPHET ER 25 MG PO CP24
25.0000 mg | ORAL_CAPSULE | Freq: Every morning | ORAL | 0 refills | Status: DC
  Filled 2022-12-08: qty 90, 90d supply, fill #0

## 2022-12-08 MED ORDER — AMPHETAMINE-DEXTROAMPHETAMINE 10 MG PO TABS
5.0000 mg | ORAL_TABLET | Freq: Every day | ORAL | 0 refills | Status: DC
  Filled 2022-12-08: qty 45, 90d supply, fill #0

## 2022-12-08 NOTE — Assessment & Plan Note (Signed)
Will refill medication and switch to generic she is otherwise happy with her medications.  We did discuss that there can be a slight difference but she 70 pounds or concerns please let me know but otherwise we will move forward with generic.

## 2022-12-08 NOTE — Progress Notes (Signed)
Called pt and lvm advising her that I was calling to do her prescreening.   Pt stated that the Adderall will need to be changed from branded to Generic.    [facility] did her pap last year will call for results.

## 2022-12-08 NOTE — Assessment & Plan Note (Signed)
Is actually doing really well off of all SSRIs and feels like things are in a good place.

## 2022-12-08 NOTE — Progress Notes (Signed)
Virtual Visit via Video Note  I connected with Anita Tanner on 12/08/22 at  1:00 PM EDT by a video enabled telemedicine application and verified that I am speaking with the correct person using two identifiers.   I discussed the limitations of evaluation and management by telemedicine and the availability of in person appointments. The patient expressed understanding and agreed to proceed.  Patient location: at home Provider location: in office  Subjective:    CC:   Chief Complaint  Patient presents with   ADHD    HPI:  Mood has been good off the medication.  Would like to switch to generic bc of copay will be over 200 for the brand. She hasn't tried   She has an IUD and has had more side effects. Was getting som pain afterwards.  Husband can feel the strings.   Past medical history, Surgical history, Family history not pertinant except as noted below, Social history, Allergies, and medications have been entered into the medical record, reviewed, and corrections made.    Objective:    General: Speaking clearly in complete sentences without any shortness of breath.  Alert and oriented x3.  Normal judgment. No apparent acute distress.    Impression and Recommendations:    Problem List Items Addressed This Visit       Other   Anxiety and depression    Is actually doing really well off of all SSRIs and feels like things are in a good place.      ADHD (attention deficit hyperactivity disorder) - Primary    Will refill medication and switch to generic she is otherwise happy with her medications.  We did discuss that there can be a slight difference but she 70 pounds or concerns please let me know but otherwise we will move forward with generic.      Relevant Medications   amphetamine-dextroamphetamine (ADDERALL XR) 25 MG 24 hr capsule   Other Visit Diagnoses     Encounter for contraceptive management, unspecified type          Contraceptive  management-she just had more symptoms with pelvic discomfort since having the new IUD put in.  She is can to give it a couple more months and see if things settle down but if not she may have it replaced we did discuss the option of a NuvaRing or Ortho Evra patch as an additional option she and her husband do not want to have any children in the future.  Will call to get pap smear results from [facility] [place]  No orders of the defined types were placed in this encounter.   Meds ordered this encounter  Medications   amphetamine-dextroamphetamine (ADDERALL) 10 MG tablet    Sig: Take 0.5 tablets (5 mg total) by mouth daily in the afternoon.    Dispense:  45 tablet    Refill:  0    Generic please   amphetamine-dextroamphetamine (ADDERALL XR) 25 MG 24 hr capsule    Sig: Take 1 capsule by mouth every morning.    Dispense:  90 capsule    Refill:  0    Pls generic     I discussed the assessment and treatment plan with the patient. The patient was provided an opportunity to ask questions and all were answered. The patient agreed with the plan and demonstrated an understanding of the instructions.   The patient was advised to call back or seek an in-person evaluation if the symptoms worsen or if the  condition fails to improve as anticipated.  I spent 17 minutes on the day of the encounter to include pre-visit record review, face-to-face time with the patient and post visit ordering of test.   [name], MD

## 2022-12-10 ENCOUNTER — Encounter: Payer: Self-pay | Admitting: Family Medicine

## 2023-02-03 ENCOUNTER — Other Ambulatory Visit (HOSPITAL_BASED_OUTPATIENT_CLINIC_OR_DEPARTMENT_OTHER): Payer: Self-pay

## 2023-02-03 DIAGNOSIS — Z1159 Encounter for screening for other viral diseases: Secondary | ICD-10-CM | POA: Diagnosis not present

## 2023-02-03 DIAGNOSIS — R350 Frequency of micturition: Secondary | ICD-10-CM | POA: Diagnosis not present

## 2023-02-03 DIAGNOSIS — Z114 Encounter for screening for human immunodeficiency virus [HIV]: Secondary | ICD-10-CM | POA: Diagnosis not present

## 2023-02-03 DIAGNOSIS — Z113 Encounter for screening for infections with a predominantly sexual mode of transmission: Secondary | ICD-10-CM | POA: Diagnosis not present

## 2023-02-03 MED ORDER — FLUCONAZOLE 150 MG PO TABS
150.0000 mg | ORAL_TABLET | Freq: Every day | ORAL | 0 refills | Status: DC | PRN
  Filled 2023-02-03: qty 3, 7d supply, fill #0

## 2023-02-03 MED ORDER — SULFAMETHOXAZOLE-TRIMETHOPRIM 800-160 MG PO TABS
1.0000 | ORAL_TABLET | Freq: Two times a day (BID) | ORAL | 0 refills | Status: DC
  Filled 2023-02-03: qty 6, 3d supply, fill #0

## 2023-04-06 ENCOUNTER — Other Ambulatory Visit (HOSPITAL_BASED_OUTPATIENT_CLINIC_OR_DEPARTMENT_OTHER): Payer: Self-pay

## 2023-04-06 ENCOUNTER — Telehealth (INDEPENDENT_AMBULATORY_CARE_PROVIDER_SITE_OTHER): Payer: Commercial Managed Care - PPO | Admitting: Family Medicine

## 2023-04-06 DIAGNOSIS — F909 Attention-deficit hyperactivity disorder, unspecified type: Secondary | ICD-10-CM

## 2023-04-06 MED ORDER — AMPHETAMINE-DEXTROAMPHET ER 25 MG PO CP24
25.0000 mg | ORAL_CAPSULE | Freq: Every morning | ORAL | 0 refills | Status: DC
Start: 2023-04-06 — End: 2023-08-23
  Filled 2023-04-06: qty 90, 90d supply, fill #0

## 2023-04-06 NOTE — Assessment & Plan Note (Signed)
Well controlled. Continue current regimen. Follow up in  6 mo  

## 2023-04-06 NOTE — Progress Notes (Signed)
    Virtual Visit via Video Note  I connected with Anita Tanner on 04/06/23 at  1:00 PM EDT by a video enabled telemedicine application and verified that I am speaking with the correct person using two identifiers.   I discussed the limitations of evaluation and management by telemedicine and the availability of in person appointments. The patient expressed understanding and agreed to proceed.  Patient location: at home Provider location: in office  Subjective:    CC:   Chief Complaint  Patient presents with   Medication Refill    HPI:  ADHD - Reports symptoms are well controlled on current regime. Denies any problems with insomnia, chest pain, palpitations, or SOB.  Usually skips weekend and the generic is working well.   Decided to keep the IUD for now. Things seems better so keeping it for now.   Had a UTI and yeast infection and went to her gyn for treatment  in Mid- June.     Past medical history, Surgical history, Family history not pertinant except as noted below, Social history, Allergies, and medications have been entered into the medical record, reviewed, and corrections made.    Objective:    General: Speaking clearly in complete sentences without any shortness of breath.  Alert and oriented x3.  Normal judgment. No apparent acute distress.    Impression and Recommendations:    Problem List Items Addressed This Visit       Other   ADHD (attention deficit hyperactivity disorder) - Primary    Well controlled. Continue current regimen. Follow up in  6 mo       Relevant Medications   amphetamine-dextroamphetamine (ADDERALL XR) 25 MG 24 hr capsule   Did get last Pap smear report and have abstracted that into her chart.  Everything was normal.  Again she is decided to keep the IUD for now so we will leave that on the medication list.  She is off of her SSRI and still doing well overall.  She had a close friend moved away out of state so that is made her  feel little bit more down but otherwise feels like she is in a good place.  No orders of the defined types were placed in this encounter.   Meds ordered this encounter  Medications   amphetamine-dextroamphetamine (ADDERALL XR) 25 MG 24 hr capsule    Sig: Take 1 capsule by mouth every morning.    Dispense:  90 capsule    Refill:  0    Pls generic    I discussed the assessment and treatment plan with the patient. The patient was provided an opportunity to ask questions and all were answered. The patient agreed with the plan and demonstrated an understanding of the instructions.   The patient was advised to call back or seek an in-person evaluation if the symptoms worsen or if the condition fails to improve as anticipated.   [name], MD

## 2023-07-09 ENCOUNTER — Other Ambulatory Visit (HOSPITAL_BASED_OUTPATIENT_CLINIC_OR_DEPARTMENT_OTHER): Payer: Self-pay

## 2023-07-09 MED ORDER — IBUPROFEN 600 MG PO TABS
600.0000 mg | ORAL_TABLET | ORAL | 0 refills | Status: DC
  Filled 2023-07-09: qty 10, 2d supply, fill #0

## 2023-08-23 ENCOUNTER — Other Ambulatory Visit: Payer: Self-pay | Admitting: Family Medicine

## 2023-08-23 ENCOUNTER — Other Ambulatory Visit (HOSPITAL_BASED_OUTPATIENT_CLINIC_OR_DEPARTMENT_OTHER): Payer: Self-pay

## 2023-08-23 DIAGNOSIS — F909 Attention-deficit hyperactivity disorder, unspecified type: Secondary | ICD-10-CM

## 2023-08-27 ENCOUNTER — Other Ambulatory Visit (HOSPITAL_BASED_OUTPATIENT_CLINIC_OR_DEPARTMENT_OTHER): Payer: Self-pay

## 2023-08-27 MED ORDER — AMPHETAMINE-DEXTROAMPHET ER 25 MG PO CP24
25.0000 mg | ORAL_CAPSULE | Freq: Every morning | ORAL | 0 refills | Status: DC
  Filled 2023-08-27: qty 90, 90d supply, fill #0

## 2023-09-13 ENCOUNTER — Telehealth (INDEPENDENT_AMBULATORY_CARE_PROVIDER_SITE_OTHER): Payer: Commercial Managed Care - PPO | Admitting: Family Medicine

## 2023-09-13 ENCOUNTER — Ambulatory Visit: Payer: Self-pay | Admitting: Family Medicine

## 2023-09-13 ENCOUNTER — Other Ambulatory Visit (HOSPITAL_BASED_OUTPATIENT_CLINIC_OR_DEPARTMENT_OTHER): Payer: Self-pay

## 2023-09-13 DIAGNOSIS — L03011 Cellulitis of right finger: Secondary | ICD-10-CM | POA: Insufficient documentation

## 2023-09-13 MED ORDER — DOXYCYCLINE HYCLATE 100 MG PO TABS
100.0000 mg | ORAL_TABLET | Freq: Two times a day (BID) | ORAL | 0 refills | Status: AC
  Filled 2023-09-13: qty 14, 7d supply, fill #0

## 2023-09-13 MED ORDER — MUPIROCIN 2 % EX OINT
TOPICAL_OINTMENT | CUTANEOUS | 3 refills | Status: DC
  Filled 2023-09-13: qty 44, 30d supply, fill #0

## 2023-09-13 NOTE — Progress Notes (Signed)
   Acute Office Visit  Subjective:     Patient ID: Anita Tanner, female    DOB: 03-24-2001, 23 y.o.   MRN: [id number]  No chief complaint on file.  I connected with  [name] on 09/13/23 by a video and audio enabled telemedicine application and verified that I am speaking with the correct person using two identifiers.  Patient Location: Home  Provider Location: Office/Clinic  I discussed the limitations of evaluation and management by telemedicine. The patient expressed understanding and agreed to proceed.  HPI Patient on video visit for concern of right pointer finger infection. Said it started yesterday and then started draining today  Review of Systems  Constitutional:  Negative for chills and fever.  Respiratory:  Negative for cough and shortness of breath.   Cardiovascular:  Negative for chest pain.  Skin:        finger pain  Neurological:  Negative for headaches.        Objective:     Physical Exam Vitals reviewed.  Constitutional:      Appearance: She is well-developed.  HENT:     Head: Normocephalic and atraumatic.  Eyes:     Conjunctiva/sclera: Conjunctivae normal.  Cardiovascular:     Rate and Rhythm: Normal rate.  Pulmonary:     Effort: Pulmonary effort is normal.  Skin:    General: Skin is dry.     Comments: From video quality right pointer finger has some redness and swelling present compared to other hand  Neurological:     Mental Status: She is alert and oriented to person, place, and time.  Psychiatric:        Behavior: Behavior normal.     No results found for any visits on 09/13/23.      Assessment & Plan:   Problem List Items Addressed This Visit       Musculoskeletal and Integument   Paronychia of finger of right hand - Primary   Based on limited physical exam due to telehealth video quality in addition to her history likely patient has a paroncyhia of the pointer finger on the right hand. At this point, we will  be on the cautionary side and treat with antibiotics since I cannot examen in clinic day. Also recommended warm water soaks. We will go ahead and treat with mupirocin ointment and doxycycline. Discussed RTC and ED precautions  - follow up in clinic one week      Relevant Medications   mupirocin ointment (BACTROBAN) 2 %    Meds ordered this encounter  Medications   mupirocin ointment (BACTROBAN) 2 %    Sig: Apply to affected area TID for 7 days.    Dispense:  30 g    Refill:  3   doxycycline (VIBRA-TABS) 100 MG tablet    Sig: Take 1 tablet (100 mg total) by mouth 2 (two) times daily for 7 days.    Dispense:  14 tablet    Refill:  0    Return in about 1 week (around 09/20/2023) for with pcp.  Charlton Amor, DO

## 2023-09-13 NOTE — Telephone Encounter (Signed)
Called pt and lvm asking that she rtn call to see if she could come in today to see [facility] or possibly tomorrow to see Dr. Linford Arnold. Asked that she rtn call.

## 2023-09-13 NOTE — Telephone Encounter (Signed)
Copied from CRM [id number]. Topic: Clinical - Red Word Triage >> Sep 13, 2023  8:32 AM Dennison Nancy wrote: Red Word that prompted transfer to Nurse Triage: right ring finger maybe infected , Red and Swollen  Chief Complaint: possible finger infection Symptoms: swollen, red, bruised, pus drainage, moderate pain Frequency: yesterday Pertinent Negatives: Patient denies fever Disposition: [] ED /[] Urgent Care (no appt availability in office) / [] Appointment(In office/virtual)/ [x]  [place] [facility]/ [] Home Care/ [] Refused Recommended Disposition /[] [place] [facility]/ []  Follow-up with PCP Additional Notes: pt states she bites nails and picks with nails and possibly caused this irritation.  Answer Assessment - Initial Assessment Questions 1. APPEARANCE of BLISTER: "What does it look like?" Looks infected      2. SIZE: "How large is the blister?" (inches, cm or compare to coins)    Top of nail bed to end of finger  3. LOCATION: "Where are the blisters located?"      Right ring finger 4. WHEN: "When did the blister happen?"     yesterday 5. CAUSE: "What do you think caused the blister?"     Biting fingernails and picking at nails 6. PAIN: "Does it hurt?" If Yes, ask: "How bad is the pain?"  (Scale 1-10; or mild, moderate, severe)     moderate 7. OTHER SYMPTOMS: "Do you have any other symptoms?" (e.g., fever)     Red, swelling, discharge from nail bed - yellowish to cream, greenish yellow color to finger  Protocols used: Blister - Foot and Hand-A-AH

## 2023-09-13 NOTE — Assessment & Plan Note (Addendum)
Based on limited physical exam due to telehealth video quality in addition to her history likely patient has a paroncyhia of the pointer finger on the right hand. At this point, we will be on the cautionary side and treat with antibiotics since I cannot examen in clinic day. Also recommended warm water soaks. We will go ahead and treat with mupirocin ointment and doxycycline. Discussed RTC and ED precautions  - follow up in clinic one week

## 2023-09-16 NOTE — Telephone Encounter (Signed)
Patient seen 09/13/23 by Dr. Tamera Punt. Telehealth.

## 2023-10-05 ENCOUNTER — Ambulatory Visit: Payer: Commercial Managed Care - PPO | Admitting: Family Medicine

## 2023-10-05 ENCOUNTER — Encounter: Payer: Self-pay | Admitting: Family Medicine

## 2023-10-05 VITALS — BP 126/76 | HR 101 | Ht 63.0 in | Wt 124.0 lb

## 2023-10-05 DIAGNOSIS — Z113 Encounter for screening for infections with a predominantly sexual mode of transmission: Secondary | ICD-10-CM | POA: Diagnosis not present

## 2023-10-05 NOTE — Progress Notes (Signed)
   Acute Office Visit  Subjective:     Patient ID: Anita Tanner, female    DOB: 2001-02-17, 23 y.o.   MRN: [id number]  No chief complaint on file.   HPI Patient is in today for STI testing.  Unfortunately she and her husband are separating she actually would back in with her mom last week.  He did have other relationships during their marriage and so she would like to be evaluated.  She did just complete a round of antibiotics and has noticed a vaginal odor and wants to also make sure that she does not have BV or yeast infection.  ROS      Objective:    BP 126/76   Pulse (!) 101   Ht 5\' 3"  (1.6 m)   Wt 124 lb (56.2 kg)   SpO2 100%   BMI 21.97 kg/m    Physical Exam Vitals reviewed.  Constitutional:      Appearance: Normal appearance.  HENT:     Head: Normocephalic.  Pulmonary:     Effort: Pulmonary effort is normal.  Neurological:     Mental Status: She is alert and oriented to person, place, and time.  Psychiatric:        Mood and Affect: Mood normal.        Behavior: Behavior normal.     No results found for any visits on 10/05/23.      Assessment & Plan:   Problem List Items Addressed This Visit   None Visit Diagnoses       Screening examination for STI    -  Primary   Relevant Orders   HIV antibody (with reflex)   RPR   Hepatitis C Antibody   Hepatitis B Surface AntiGEN   NuSwab Vaginitis Plus (VG+)      Testing performed she also wanted to be evaluated for BV and yeast as she has had a vaginal odor and just completed a round of antibiotics as well will call with results once available.  Right now emotionally she feels like she is doing okay and has good support but if at any point she feels like she is struggling and happy to refer for counseling if she would like.   No orders of the defined types were placed in this encounter.  I spent 20 minutes on the day of the encounter to include pre-visit record review, face-to-face time with the  patient and post visit ordering of test.   No follow-ups on file.  [name], MD

## 2023-10-06 ENCOUNTER — Encounter: Payer: Self-pay | Admitting: Family Medicine

## 2023-10-06 LAB — HEPATITIS B SURFACE ANTIGEN: Hepatitis B Surface Ag: NEGATIVE

## 2023-10-06 LAB — HIV ANTIBODY (ROUTINE TESTING W REFLEX): HIV Screen 4th Generation wRfx: NONREACTIVE

## 2023-10-06 LAB — HEPATITIS C ANTIBODY: Hep C Virus Ab: NONREACTIVE

## 2023-10-06 LAB — RPR: RPR Ser Ql: NONREACTIVE

## 2023-10-06 NOTE — Progress Notes (Signed)
Negative for HIV and negative for hepatitis C.  Negative for hepatitis B and negative for syphilis.  Still awaiting the swab results.

## 2023-10-07 ENCOUNTER — Other Ambulatory Visit (HOSPITAL_BASED_OUTPATIENT_CLINIC_OR_DEPARTMENT_OTHER): Payer: Self-pay

## 2023-10-07 MED ORDER — METRONIDAZOLE 500 MG PO TABS
500.0000 mg | ORAL_TABLET | Freq: Two times a day (BID) | ORAL | 0 refills | Status: DC
  Filled 2023-10-07: qty 14, 7d supply, fill #0

## 2023-10-07 MED ORDER — METRONIDAZOLE 500 MG PO TABS: 500.0000 mg | ORAL_TABLET | Freq: Two times a day (BID) | ORAL | 0 refills | Status: DC

## 2023-10-07 NOTE — Addendum Note (Signed)
Addended by: [name] D on: 10/07/2023 07:57 AM   Modules accepted: Orders

## 2023-10-07 NOTE — Progress Notes (Signed)
Hi [facility], swab shows bacterial vaginitis.  I will send over treatment.

## 2023-10-08 LAB — NUSWAB VAGINITIS PLUS (VG+)
Atopobium vaginae: HIGH {score} — AB
BVAB 2: HIGH {score} — AB
Candida albicans, NAA: NEGATIVE
Candida glabrata, NAA: NEGATIVE
Megasphaera 1: HIGH {score} — AB

## 2023-10-08 LAB — SPECIMEN STATUS REPORT

## 2023-10-31 ENCOUNTER — Encounter: Payer: Self-pay | Admitting: Family Medicine

## 2023-11-01 ENCOUNTER — Ambulatory Visit: Admitting: Family Medicine

## 2023-11-01 VITALS — BP 100/69 | HR 87 | Temp 98.0°F | Resp 18 | Ht 63.0 in | Wt 125.6 lb

## 2023-11-01 DIAGNOSIS — B9689 Other specified bacterial agents as the cause of diseases classified elsewhere: Secondary | ICD-10-CM | POA: Diagnosis not present

## 2023-11-01 DIAGNOSIS — N760 Acute vaginitis: Secondary | ICD-10-CM | POA: Diagnosis not present

## 2023-11-01 MED ORDER — FLUCONAZOLE 150 MG PO TABS
150.0000 mg | ORAL_TABLET | Freq: Once | ORAL | 0 refills | Status: AC
  Filled 2023-11-02: qty 1, 1d supply, fill #0

## 2023-11-01 NOTE — Assessment & Plan Note (Addendum)
 Positive BV in February.  Treated with Flagyl 500 mg BID x 7 days. Presents today with symptoms of yeast infection. Prescribed diflucan per PCP office today. Concerned for recurring infections. Wants to be tested today for  BV/ CT/NG/Tv and yeast.  She will self swab.

## 2023-11-01 NOTE — Progress Notes (Signed)
   Acute Office Visit  Subjective:     Patient ID: Anita Tanner, female    DOB: 2000-10-09, 23 y.o.   MRN: [id number]  Chief Complaint  Patient presents with   Vaginal Itching    Patient is here for symptoms of yeast infection , she states that she took medication for BV and her symptoms started after completing  antibiotics. She states that Friday it started out with itching and thick discharge , by Saturday she states that symptoms had gotten worse and she actually started spotting .     HPI Patient is in today for possible yeast infection. Prescribed diflucan today per PCP office.  Symptoms started after completing antibiotic prescribed for BV in February.  Symptoms present since Saturday.   Wants to get retested today for BV, yeast, and STDs today. Denies urinary symptoms.     Review of Systems  Genitourinary:  Negative for dysuria, frequency and urgency.        Objective:    BP 100/69   Pulse 87   Temp 98 F (36.7 C) (Oral)   Resp 18   Ht 5\' 3"  (1.6 m)   Wt 125 lb 9.6 oz (57 kg)   BMI 22.25 kg/m  BP Readings from Last 3 Encounters:  11/01/23 100/69  10/05/23 126/76  08/18/22 108/70      Physical Exam Vitals and nursing note reviewed.  Constitutional:      General: She is not in acute distress.    Appearance: Normal appearance. She is normal weight.  Cardiovascular:     Rate and Rhythm: Normal rate.  Pulmonary:     Effort: Pulmonary effort is normal.  Skin:    General: Skin is warm and dry.  Neurological:     General: No focal deficit present.     Mental Status: She is alert. Mental status is at baseline.  Psychiatric:        Mood and Affect: Mood normal.        Behavior: Behavior normal.        Thought Content: Thought content normal.        Judgment: Judgment normal.    No results found for any visits on 11/01/23.      Assessment & Plan:   Problem List Items Addressed This Visit     BV (bacterial vaginosis) - Primary   Positive  BV in February.  Treated with Flagyl 500 mg BID x 7 days. Presents today with symptoms of yeast infection. Prescribed diflucan per PCP office today. Concerned for recurring infections. Wants to be tested today for  BV/ CT/NG/Tv and yeast.  She will self swab.       Relevant Orders   BV+Ct/Ng/Tv NAA+Yeast Culture  Agrees with plan of care discussed.  Questions answered.    Return if symptoms worsen or fail to improve.  Novella Olive, FNP

## 2023-11-02 ENCOUNTER — Other Ambulatory Visit (HOSPITAL_COMMUNITY): Payer: Self-pay

## 2023-11-02 ENCOUNTER — Other Ambulatory Visit (HOSPITAL_BASED_OUTPATIENT_CLINIC_OR_DEPARTMENT_OTHER): Payer: Self-pay

## 2023-11-02 ENCOUNTER — Other Ambulatory Visit: Payer: Self-pay

## 2023-11-02 NOTE — Telephone Encounter (Unsigned)
 Copied from CRM [id number]. Topic: Clinical - Prescription Issue >> Nov 02, 2023  1:07 PM Nila Nephew wrote: Reason for CRM: Patient calling to request that Diflucan sent in for yeast infection be sent to the [facility].

## 2023-11-03 NOTE — Telephone Encounter (Signed)
 Copied from CRM [id number]. Topic: Clinical - Lab/Test Results >> Nov 02, 2023 10:26 AM Shelah Lewandowsky wrote: Reason for CRM: calling for lab results- please call [phone]

## 2023-11-04 ENCOUNTER — Ambulatory Visit: Payer: Self-pay | Admitting: Family Medicine

## 2023-11-04 ENCOUNTER — Encounter: Payer: Self-pay | Admitting: Family Medicine

## 2023-11-04 NOTE — Telephone Encounter (Signed)
 Copied from CRM [id number]. Topic: Clinical - Lab/Test Results >> Nov 02, 2023 10:26 AM Shelah Lewandowsky wrote: Reason for CRM: calling for lab results- please call [phone] >> Nov 04, 2023  3:05 PM Fredrica W wrote: Patient called back in to check status of lab results for swab. No notes on results from provider. Patient would like call back once results available. Thank You

## 2023-11-04 NOTE — Telephone Encounter (Signed)
  Chief Complaint: vaginal irritation Symptoms: vaginal irritation/small amounts of discharge Frequency: symptoms started last thursday Pertinent Negatives: Patient denies fever Disposition: [] ED /[] Urgent Care (no appt availability in office) / [] Appointment(In office/virtual)/ []  [place] [facility]/ [] Home Care/ [] Refused Recommended Disposition /[] [place] [facility]/ [x]  Follow-up with PCP Additional Notes: patient calling with the main objective to check on the status of the vaginal swab down at her visit on 11/01/2023. Patient states the irritation and itching is moderate. Patient would like an update on the swab and be called with results. Per protocol, patient is recommended to be seen within 3 days. Patient states that if she doesn't hear anything about her swab today, she is will calling her OB and making an appointment. Patient verbalized understanding of plan and all questions answered.     Summary: Vaginal Irration Advice   Copied From CRM [id number]. Reason for Triage: Patient is calling to report that she was seen at [facility] on 10/23/23. Because there were no appointments available with Dr. Linford Arnold. Her results are not back yet. Patient is reporting irration around the vulva and rawness. Please advise     Reason for Disposition  [1] Symptoms of a yeast infection (i.e., itchy, white discharge, not bad smelling) AND [2] not improved > 3 days following Care Advice  Answer Assessment - Initial Assessment Questions 1. SYMPTOM: "What's the main symptom you're concerned about?" (e.g., pain, itching, dryness)     Irritation and itching 2. LOCATION: "Where is the  irritation and itching located?" (e.g., inside/outside, left/right)     Outside around the labia 3. ONSET: "When did the  irritation and itching  start?"     Last Thursday 4. PAIN: "Is there any pain?" If Yes, ask: "How bad is it?" (Scale: 1-10; mild, moderate, severe)   -  MILD (1-3): Doesn't interfere with normal  activities.    -  MODERATE (4-7): Interferes with normal activities (e.g., work or school) or awakens from sleep.     -  SEVERE (8-10): Excruciating pain, unable to do any normal activities.     No 5. ITCHING: "Is there any itching?" If Yes, ask: "How bad is it?" (Scale: 1-10; mild, moderate, severe)     Yes mild-moderate 6. CAUSE: "What do you think is causing the discharge?" "Have you had the same problem before? What happened then?"     Concerned for yeast infection 7. OTHER SYMPTOMS: "Do you have any other symptoms?" (e.g., fever, itching, vaginal bleeding, pain with urination, injury to genital area, vaginal foreign body)     Started after spotting after trying a monistat treatment 8. PREGNANCY: "Is there any chance you are pregnant?" "When was your last menstrual period?"     No  Protocols used: Vaginal Symptoms-A-AH

## 2023-11-05 ENCOUNTER — Encounter: Payer: Self-pay | Admitting: Family Medicine

## 2023-11-05 LAB — BV+CT/NG/TV NAA+YEAST CULTURE
BV, Sialidase Activity: NEGATIVE
Chlamydia by NAA: NEGATIVE
Gonococcus by NAA: NEGATIVE
Trich vag by NAA: NEGATIVE
Vaginal Yeast Culture: POSITIVE — AB

## 2023-11-05 NOTE — Telephone Encounter (Addendum)
 Patient was seen at [facility] by Dorena Bodo for this on 11/01/2023. Dr. Linford Arnold saw her on 10/06/2023 and she was treated for BV at that time.   Routing to [employer] for follow up

## 2023-11-05 NOTE — Telephone Encounter (Signed)
 Patient states she doesn't have the irritation today. She is using over the counter yeast infection creams. She will go to her GYN if symptoms do not resolve.

## 2023-11-07 ENCOUNTER — Encounter: Payer: Self-pay | Admitting: Family Medicine

## 2023-11-07 ENCOUNTER — Other Ambulatory Visit: Payer: Self-pay | Admitting: Family Medicine

## 2023-11-07 DIAGNOSIS — B3731 Acute candidiasis of vulva and vagina: Secondary | ICD-10-CM | POA: Insufficient documentation

## 2023-11-07 MED ORDER — FLUCONAZOLE 150 MG PO TABS
150.0000 mg | ORAL_TABLET | Freq: Once | ORAL | 0 refills | Status: AC
  Filled 2023-11-07: qty 1, 1d supply, fill #0

## 2023-11-07 NOTE — Telephone Encounter (Signed)
 Results of vaginal swab sent to [facility] 11/05/23 at 0548. Diflucan 150 mg sent to  pharmacy for yeast infection. BV negative after recent treatment.

## 2023-11-08 ENCOUNTER — Other Ambulatory Visit (HOSPITAL_BASED_OUTPATIENT_CLINIC_OR_DEPARTMENT_OTHER): Payer: Self-pay

## 2024-01-03 DIAGNOSIS — N898 Other specified noninflammatory disorders of vagina: Secondary | ICD-10-CM | POA: Diagnosis not present

## 2024-01-04 ENCOUNTER — Other Ambulatory Visit: Payer: Self-pay | Admitting: Family Medicine

## 2024-01-04 DIAGNOSIS — F909 Attention-deficit hyperactivity disorder, unspecified type: Secondary | ICD-10-CM

## 2024-01-04 NOTE — Telephone Encounter (Signed)
 Last Fill: 08/27/23 90 tabs/0 RF  Last OV: 10/05/23 Next OV: None Scheduled  Routing to provider for review/authorization.

## 2024-01-04 NOTE — Telephone Encounter (Unsigned)
 Copied from CRM [id number]. Topic: Clinical - Medication Refill >> Jan 04, 2024 12:25 PM Danelle Dunning F wrote: Patient called in to place her prescription refill.   Medication: amphetamine -dextroamphetamine  (ADDERALL  XR) 25 MG 24 hr capsule  Has the patient contacted their pharmacy? No  This is the patient's preferred pharmacy:  [facility] [place] [facility] [address], [address] [place] [address] Phone: [phone] Fax: [phone]  Is this the correct pharmacy for this prescription? Yes   Has the prescription been filled recently? No  Is the patient out of the medication? Yes  Has the patient been seen for an appointment in the last year OR does the patient have an upcoming appointment? Yes  Can we respond through MyChart? Yes  Agent: Please be advised that Rx refills may take up to 3 business days. We ask that you follow-up with your pharmacy.

## 2024-01-05 ENCOUNTER — Other Ambulatory Visit: Payer: Self-pay

## 2024-01-05 ENCOUNTER — Other Ambulatory Visit (HOSPITAL_BASED_OUTPATIENT_CLINIC_OR_DEPARTMENT_OTHER): Payer: Self-pay

## 2024-01-05 MED ORDER — AMPHETAMINE-DEXTROAMPHET ER 25 MG PO CP24
25.0000 mg | ORAL_CAPSULE | Freq: Every morning | ORAL | 0 refills | Status: DC
  Filled 2024-01-05: qty 90, 90d supply, fill #0

## 2024-01-11 ENCOUNTER — Other Ambulatory Visit (HOSPITAL_BASED_OUTPATIENT_CLINIC_OR_DEPARTMENT_OTHER): Payer: Self-pay

## 2024-01-11 DIAGNOSIS — A6004 Herpesviral vulvovaginitis: Secondary | ICD-10-CM | POA: Diagnosis not present

## 2024-01-11 DIAGNOSIS — N898 Other specified noninflammatory disorders of vagina: Secondary | ICD-10-CM | POA: Diagnosis not present

## 2024-01-11 MED ORDER — VALACYCLOVIR HCL 500 MG PO TABS
ORAL_TABLET | ORAL | 3 refills | Status: AC
  Filled 2024-01-11: qty 100, 90d supply, fill #0
  Filled 2024-05-18: qty 100, 90d supply, fill #1

## 2024-04-09 ENCOUNTER — Ambulatory Visit (INDEPENDENT_AMBULATORY_CARE_PROVIDER_SITE_OTHER)

## 2024-04-09 ENCOUNTER — Ambulatory Visit
Admission: EM | Admit: 2024-04-09 | Discharge: 2024-04-09 | Disposition: A | Discharge status: Home / Self Care | Attending: Internal Medicine | Admitting: Internal Medicine

## 2024-04-09 ENCOUNTER — Encounter: Payer: Self-pay | Admitting: Emergency Medicine

## 2024-04-09 DIAGNOSIS — M545 Low back pain, unspecified: Secondary | ICD-10-CM

## 2024-04-09 MED ORDER — TIZANIDINE HCL 2 MG PO TABS
2.0000 mg | ORAL_TABLET | Freq: Three times a day (TID) | ORAL | 0 refills | Status: DC | PRN
Start: 2024-04-09 — End: 2024-07-18

## 2024-04-09 NOTE — ED Provider Notes (Signed)
 [facility]    CSN: [id number] Arrival date & time: 04/09/24  1116      History   Chief Complaint Chief Complaint  Patient presents with   Back Pain    HPI Anita Tanner is a 23 y.o. female.   Anita Tanner is a 23 y.o. female who complains of low back pain for 2 day(s), worse with certain positions especially standing and bending, without radiation down the legs. Precipitating factors: getting up off the couch in a weird way causing mid lower back pain. Prior history of back problems: no prior back problems. There is not numbness in the legs.  No bowel or bladder incontinence.  The pain is right in the center of the back in the lower aspect near the sacrum.  No dysuria, hematuria, abdominal pain, fevers, chills.  She does report some improvement after using alternating heat and ice as well as Epsom salt bath.     Back Pain Associated symptoms: no abdominal pain, no chest pain, no dysuria and no fever     Past Medical History:  Diagnosis Date   ADD (attention deficit disorder)    Anxiety    Chest pain    Depression    Fracture of wrist    left 2008, bilat 2011, right 2013    Patient Active Problem List   Diagnosis Date Noted   Vaginal candidiasis 11/07/2023   BV (bacterial vaginosis) 11/01/2023   Paronychia of finger of right hand 09/13/2023   Elevated bilirubin 07/15/2020   Gallbladder polyp 07/12/2020   Class 1 obesity due to excess calories without serious comorbidity with body mass index (BMI) of 31.0 to 31.9 in adult 10/09/2019   Abnormal weight gain 08/07/2019   Iron deficiency anemia due to chronic blood loss 12/16/2016   MDD (major depressive disorder), recurrent severe, without psychosis (HCC) 04/10/2015   Migraine headache 01/05/2014   Anxiety and depression 08/09/2012   ADHD (attention deficit hyperactivity disorder) 05/16/2012   Allergic rhinitis 01/05/2011   EPISTAXIS, RECURRENT 01/05/2011   Constipation 06/30/2010     Past Surgical History:  Procedure Laterality Date   TYMPANOSTOMY TUBE PLACEMENT  2002    OB History   No obstetric history on file.      Home Medications    Prior to Admission medications   Medication Sig Start Date End Date Taking? Authorizing Provider  amphetamine -dextroamphetamine  (ADDERALL  XR) 25 MG 24 hr capsule Take 1 capsule by mouth every morning. 01/05/24 07/03/24 Yes Metheney, Deronda D, MD  ipratropium (ATROVENT ) 0.03 % nasal spray Place 2 sprays into both nostrils 3 (three) times daily as needed for rhinitis. 05/16/19  Yes Fawze, Mina A, PA-C  valACYclovir  (VALTREX ) 500 MG tablet Take 1 tablet daily for prevention, increase to twice daily for x 3 days with symptoms. 01/11/24  Yes   levonorgestrel  (KYLEENA ) 19.5 MG IUD 1 each by Intrauterine route once for 1 dose. 07/07/22 12/08/23  Willo Mini, NP  metroNIDAZOLE  (FLAGYL ) 500 MG tablet Take 1 tablet (500 mg total) by mouth 2 (two) times daily. 10/07/23   Alvan Dorothyann BIRCH, MD  mupirocin  ointment (BACTROBAN ) 2 % Apply to affected area TID for 7 days. 09/13/23   Bevin Bernice RAMAN, DO  tiZANidine  (ZANAFLEX ) 2 MG tablet Take 1 tablet (2 mg total) by mouth every 8 (eight) hours as needed for muscle spasms. 04/09/24  Yes Teresa Almarie DELENA, PA-C    Family History Family History  Problem Relation Age of Onset   Hyperlipidemia Mother  Depression Mother    Anxiety disorder Mother    Coronary artery disease Father    ADD / ADHD Father    Hyperlipidemia Father    Heart disease Father    Sleep apnea Father     Social History Social History   Tobacco Use   Smoking status: Former    Current packs/day: 0.00    Types: Cigarettes    Quit date: 2018    Years since quitting: 7.6   Smokeless tobacco: Never  Vaping Use   Vaping status: Every Day  Substance Use Topics   Alcohol use: No   Drug use: No     Allergies   Prozac  [fluoxetine  hcl] and Viibryd  [vilazodone  hcl]   Review of Systems Review of Systems   Constitutional:  Negative for chills and fever.  HENT:  Negative for ear pain and sore throat.   Eyes:  Negative for pain and visual disturbance.  Respiratory:  Negative for cough and shortness of breath.   Cardiovascular:  Negative for chest pain and palpitations.  Gastrointestinal:  Negative for abdominal pain and vomiting.  Genitourinary:  Negative for dysuria and hematuria.  Musculoskeletal:  Positive for back pain. Negative for arthralgias.  Skin:  Negative for color change and rash.  Neurological:  Negative for seizures and syncope.  All other systems reviewed and are negative.    Physical Exam Triage Vital Signs ED Triage Vitals  Encounter Vitals Group     BP 04/09/24 1127 116/72     Girls Systolic BP Percentile --      Girls Diastolic BP Percentile --      Boys Systolic BP Percentile --      Boys Diastolic BP Percentile --      Pulse Rate 04/09/24 1127 68     Resp 04/09/24 1127 18     Temp 04/09/24 1127 99.4 F (37.4 C)     Temp Source 04/09/24 1127 Oral     SpO2 04/09/24 1127 99 %     Weight 04/09/24 1129 140 lb (63.5 kg)     Height 04/09/24 1129 5' 3 (1.6 m)     Head Circumference --      Peak Flow --      Pain Score 04/09/24 1128 3     Pain Loc --      Pain Education --      Exclude from Growth Chart --    No data found.  Updated Vital Signs BP 116/72 (BP Location: Right Arm)   Pulse 68   Temp 99.4 F (37.4 C) (Oral)   Resp 18   Ht 5' 3 (1.6 m)   Wt 140 lb (63.5 kg)   SpO2 99%   BMI 24.80 kg/m   Visual Acuity Right Eye Distance:   Left Eye Distance:   Bilateral Distance:    Right Eye Near:   Left Eye Near:    Bilateral Near:     Physical Exam Vitals and nursing note reviewed.  Constitutional:      General: She is not in acute distress.    Appearance: She is well-developed.  HENT:     Head: Normocephalic and atraumatic.  Eyes:     Conjunctiva/sclera: Conjunctivae normal.  Cardiovascular:     Rate and Rhythm: Normal rate and regular  rhythm.     Heart sounds: No murmur heard. Pulmonary:     Effort: Pulmonary effort is normal. No respiratory distress.     Breath sounds: Normal breath sounds.  Abdominal:  Palpations: Abdomen is soft.     Tenderness: There is no abdominal tenderness.  Musculoskeletal:        General: No swelling.     Cervical back: Neck supple.       Back:  Skin:    General: Skin is warm and dry.     Capillary Refill: Capillary refill takes less than 2 seconds.  Neurological:     Mental Status: She is alert.  Psychiatric:        Mood and Affect: Mood normal.      UC Treatments / Results  Labs (all labs ordered are listed, but only abnormal results are displayed) Labs Reviewed - No data to display  EKG   Radiology DG Lumbar Spine Complete Result Date: 04/09/2024 CLINICAL DATA:  Low back pain. EXAM: LUMBAR SPINE - COMPLETE 4+ VIEW COMPARISON:  None Available. FINDINGS: Normal alignment of lumbar vertebral bodies. No loss of vertebral body height or disc height. No pars fracture. No subluxation. IUD in expected location IMPRESSION: No acute findings lumbar spine. Electronically Signed   By: Jackquline Boxer M.D.   On: 04/09/2024 12:38    Procedures Procedures (including critical care time)  Medications Ordered in UC Medications - No data to display  Initial Impression / Assessment and Plan / UC Course  I have reviewed the triage vital signs and the nursing notes.  Pertinent labs & imaging results that were available during my care of the patient were reviewed by me and considered in my medical decision making (see chart for details).     Acute midline low back pain without sciatica - Plan: DG Lumbar Spine Complete, DG Lumbar Spine Complete   X-ray of the lumbar spine done today.  Final evaluation by the radiologist shows no acute findings.  Symptoms and physical exam findings along with x-ray findings are most consistent with a muscular strain in the lower back.  Recommend  continuing alternating heat and ice on the area.  Light stretching to help improve mobility.  Can alternate Tylenol  and ibuprofen  to help with pain.  If symptoms fail to resolve completely then may need to consider follow-up with orthopedics. Can also try the following for muscle relaxer: Tizanidine  (zanaflex ) 2 mg every 8 hours as needed for muscle spasms.  Use caution as this medication can cause drowsiness.  Return to urgent care or PCP if symptoms worsen or fail to resolve.  Final Clinical Impressions(s) / UC Diagnoses   Final diagnoses:  Acute midline low back pain without sciatica     Discharge Instructions      X-ray of the lumbar spine done today.  Final evaluation by the radiologist shows no acute findings.  Symptoms and physical exam findings along with x-ray findings are most consistent with a muscular strain in the lower back.  Recommend continuing alternating heat and ice on the area.  Light stretching to help improve mobility.  Can alternate Tylenol  and ibuprofen  to help with pain.  If symptoms fail to resolve completely then may need to consider follow-up with orthopedics. Can also try the following for muscle relaxer: Tizanidine  (zanaflex ) 2 mg every 8 hours as needed for muscle spasms.  Use caution as this medication can cause drowsiness.  Return to urgent care or PCP if symptoms worsen or fail to resolve.       ED Prescriptions     Medication Sig Dispense Auth. Provider   tiZANidine  (ZANAFLEX ) 2 MG tablet Take 1 tablet (2 mg total) by mouth every 8 (eight)  hours as needed for muscle spasms. 20 tablet Teresa Almarie LABOR, [place]      PDMP not reviewed this encounter.   Teresa Almarie LABOR, PA-C 04/09/24 1248

## 2024-04-09 NOTE — Discharge Instructions (Addendum)
 X-ray of the lumbar spine done today.  Final evaluation by the radiologist shows no acute findings.  Symptoms and physical exam findings along with x-ray findings are most consistent with a muscular strain in the lower back.  Recommend continuing alternating heat and ice on the area.  Light stretching to help improve mobility.  Can alternate Tylenol  and ibuprofen  to help with pain.  If symptoms fail to resolve completely then may need to consider follow-up with orthopedics. Can also try the following for muscle relaxer: Tizanidine  (zanaflex ) 2 mg every 8 hours as needed for muscle spasms.  Use caution as this medication can cause drowsiness.  Return to urgent care or PCP if symptoms worsen or fail to resolve.

## 2024-04-09 NOTE — ED Triage Notes (Signed)
 Patient states that she got up off of the couch a weird way and began having lower back pain x 2 days.  Pain has somewhat gotten better w/Ibuprofen .  No apparent injury.  Denies any urinary sx's.

## 2024-05-18 ENCOUNTER — Other Ambulatory Visit: Payer: Self-pay

## 2024-05-18 ENCOUNTER — Other Ambulatory Visit: Payer: Self-pay | Admitting: Family Medicine

## 2024-05-18 DIAGNOSIS — F909 Attention-deficit hyperactivity disorder, unspecified type: Secondary | ICD-10-CM

## 2024-05-19 ENCOUNTER — Other Ambulatory Visit (HOSPITAL_BASED_OUTPATIENT_CLINIC_OR_DEPARTMENT_OTHER): Payer: Self-pay

## 2024-05-19 ENCOUNTER — Other Ambulatory Visit: Payer: Self-pay

## 2024-05-19 MED ORDER — AMPHETAMINE-DEXTROAMPHET ER 25 MG PO CP24
25.0000 mg | ORAL_CAPSULE | Freq: Every morning | ORAL | 0 refills | Status: DC
Start: 2024-05-19 — End: 2024-07-18
  Filled 2024-05-19: qty 90, 90d supply, fill #0

## 2024-05-19 NOTE — Telephone Encounter (Signed)
 Patient scheduled for virtual appt on 05/31/24, thanks.

## 2024-05-19 NOTE — Telephone Encounter (Signed)
 Please call pt she is overdue for f/u appointment for ADHD

## 2024-05-24 ENCOUNTER — Other Ambulatory Visit (HOSPITAL_BASED_OUTPATIENT_CLINIC_OR_DEPARTMENT_OTHER): Payer: Self-pay

## 2024-05-31 ENCOUNTER — Ambulatory Visit: Admitting: Family Medicine

## 2024-07-18 ENCOUNTER — Other Ambulatory Visit (HOSPITAL_BASED_OUTPATIENT_CLINIC_OR_DEPARTMENT_OTHER): Payer: Self-pay

## 2024-07-18 ENCOUNTER — Telehealth: Admitting: Family Medicine

## 2024-07-18 ENCOUNTER — Encounter: Payer: Self-pay | Admitting: Family Medicine

## 2024-07-18 DIAGNOSIS — F909 Attention-deficit hyperactivity disorder, unspecified type: Secondary | ICD-10-CM

## 2024-07-18 MED ORDER — AMPHETAMINE-DEXTROAMPHET ER 20 MG PO CP24
20.0000 mg | ORAL_CAPSULE | Freq: Every morning | ORAL | 0 refills | Status: AC
  Filled 2024-08-14: qty 30, 30d supply, fill #0

## 2024-07-18 NOTE — Assessment & Plan Note (Signed)
 Discussed options.  I think a trial of going down to the Adderall  XR 20 mg would be worthwhile for 30 days to see if that might be a better fit and not overly suppress the appetite.  But still be effective for treating her ADHD symptoms.  If that works well then we can switch to a 90-day supply with [employer].

## 2024-07-18 NOTE — Progress Notes (Signed)
    Virtual Visit via Video Note  I connected with Anita Tanner on 07/18/24 at  9:10 AM EST by a video enabled telemedicine application and verified that I am speaking with the correct person using two identifiers.   I discussed the limitations of evaluation and management by telemedicine and the availability of in person appointments. The patient expressed understanding and agreed to proceed.  Patient location: at home Provider location: in office  Subjective:    CC:   Chief Complaint  Patient presents with   Medical Management of Chronic Issues    HPI:  She is here for virtual follow-up today for her ADHD.  Currently on Adderall  XR 25 mg she mostly takes during the workweek and typically skips the weekends.  She is thinking about actually decreasing her dose.  She has noticed that lately it just really seems to suppress her appetite she is typically only eating once or twice a day on days that she takes the medication but then on the weekends when she skips she finds that she is overeating.  She has not noticed any fluctuation in her weight.  No other symptoms.  Past medical history, Surgical history, Family history not pertinant except as noted below, Social history, Allergies, and medications have been entered into the medical record, reviewed, and corrections made.    Objective:    General: Speaking clearly in complete sentences without any shortness of breath.  Alert and oriented x3.  Normal judgment. No apparent acute distress.    Impression and Recommendations:    Problem List Items Addressed This Visit       Other   ADHD (attention deficit hyperactivity disorder) - Primary   Discussed options.  I think a trial of going down to the Adderall  XR 20 mg would be worthwhile for 30 days to see if that might be a better fit and not overly suppress the appetite.  But still be effective for treating her ADHD symptoms.  If that works well then we can switch to a 90-day  supply with [employer].      Relevant Medications   amphetamine -dextroamphetamine  (ADDERALL  XR) 20 MG 24 hr capsule (Start on 08/12/2024)    No orders of the defined types were placed in this encounter.   Meds ordered this encounter  Medications   amphetamine -dextroamphetamine  (ADDERALL  XR) 20 MG 24 hr capsule    Sig: Take 1 capsule (20 mg total) by mouth every morning.    Dispense:  30 capsule    Refill:  0    She would like to get her flu shot too while there   She is interested in getting her flu vaccine so encouraged her to ask the pharmacist today when she gets to pick up her medication if she can go ahead and get her flu shot with them.   I discussed the assessment and treatment plan with the patient. The patient was provided an opportunity to ask questions and all were answered. The patient agreed with the plan and demonstrated an understanding of the instructions.   The patient was advised to call back or seek an in-person evaluation if the symptoms worsen or if the condition fails to improve as anticipated.   Anita Byars, MD

## 2024-07-26 ENCOUNTER — Other Ambulatory Visit (HOSPITAL_BASED_OUTPATIENT_CLINIC_OR_DEPARTMENT_OTHER): Payer: Self-pay

## 2024-08-11 ENCOUNTER — Ambulatory Visit: Payer: Self-pay

## 2024-08-11 NOTE — Telephone Encounter (Signed)
 Spoke with patient. She states she can not get off of work to come in today. She will plan to keep appt on Monday and if symptoms worsen or become concerning she will go to urgent care for evaluation.

## 2024-08-11 NOTE — Telephone Encounter (Signed)
 Do we have anything on nurse schedule for UTI?

## 2024-08-11 NOTE — Telephone Encounter (Signed)
 FYI Only or Action Required?: FYI only for provider: appointment scheduled on 08/14/24.  Patient was last seen in primary care on 07/18/2024 by Alvan Dorothyann BIRCH, MD.  Called Nurse Triage reporting Dysuria.  Symptoms began yesterday.  Interventions attempted: Prescription medications: Valtrex  and Rest, hydration, or home remedies.  Symptoms are: unchanged.  Triage Disposition: See PCP Within 2 Weeks  Patient/caregiver understands and will follow disposition?: Yes   Copied from CRM #[id number]. Topic: Clinical - Red Word Triage >> Aug 11, 2024 10:18 AM Nathanel BROCKS wrote: Red Word that prompted transfer to Nurse Triage: possible uti, pressure of pain and hint of blood.    Reason for Disposition  All other urine symptoms  Answer Assessment - Initial Assessment Questions Pt called in stating she is experiencing cloudy urine and pressure at the end of urination at urethra; reports 1 occurrence of pink hue to urine 12/18 PM. Pt reports that she has HSV1 so she took a valtrex  this morning and has tried to increase her fluid intake. Pt denies pain, burning, discharge or fever; no urinary retention, able to fully empty bladder. Pt states that she was dx this year with a mass on her urethra (unsure if cyst or tumor) by OBGYN. Provider stated they would monitor at her yearly annual but did not seem overly concerned. Pt states she does not know if symptoms are related to mass or if she is having a herpes flare, UTI or BV. Based on symptoms pt was schedule appt by [facility] receptionist prior to transfer to [facility]. Discussed pt should keep appt and monitor for red flag symptoms: urinary retention, fever, odor, blood in urine, ureteral or pelvic pain which would warrant higher level of medical attention. Encouraged pt to increase water intake and recommended OTC AZO for symptom relief. Pt voiced appreciation and understanding.     1. SYMPTOM: What's the main symptom you're concerned about? (e.g., frequency,  incontinence)     Cloudy urine, hx of BV and UTIs, recently dx with a mass on urethra per OBGYN   2. ONSET: When did the  symptoms  start?     Last night, 12/18  3. PAIN: Is there any pain? If Yes, ask: How bad is it? (Scale: 1-10; mild, moderate, severe)     Denies pain   4. CAUSE: What do you think is causing the symptoms?     Unsure   5. OTHER SYMPTOMS: Do you have any other symptoms? (e.g., blood in urine, fever, flank pain, pain with urination)     Pink hue to urine 12/18 x1 occurrence; denies pain but reports pressure at the end of urination  Protocols used: Urinary Symptoms-A-AH

## 2024-08-11 NOTE — Telephone Encounter (Signed)
 Patient shows scheduled for 08/14/2024- is this too long to wait to be seen ?

## 2024-08-14 ENCOUNTER — Other Ambulatory Visit (HOSPITAL_BASED_OUTPATIENT_CLINIC_OR_DEPARTMENT_OTHER): Payer: Self-pay

## 2024-08-14 ENCOUNTER — Ambulatory Visit: Admitting: Family Medicine

## 2024-08-14 ENCOUNTER — Encounter: Payer: Self-pay | Admitting: Family Medicine

## 2024-08-14 VITALS — BP 126/87 | HR 110 | Ht 63.0 in | Wt 129.0 lb

## 2024-08-14 DIAGNOSIS — N3000 Acute cystitis without hematuria: Secondary | ICD-10-CM

## 2024-08-14 DIAGNOSIS — R8290 Unspecified abnormal findings in urine: Secondary | ICD-10-CM | POA: Diagnosis not present

## 2024-08-14 DIAGNOSIS — N368 Other specified disorders of urethra: Secondary | ICD-10-CM | POA: Diagnosis not present

## 2024-08-14 LAB — POCT URINALYSIS DIP (CLINITEK)
Bilirubin, UA: NEGATIVE
Glucose, UA: NEGATIVE mg/dL
Ketones, POC UA: NEGATIVE mg/dL
Nitrite, UA: POSITIVE — AB
Spec Grav, UA: 1.025
Urobilinogen, UA: 0.2 U/dL
pH, UA: 7.5

## 2024-08-14 MED ORDER — NITROFURANTOIN MONOHYD MACRO 100 MG PO CAPS
100.0000 mg | ORAL_CAPSULE | Freq: Two times a day (BID) | ORAL | 0 refills | Status: DC
  Filled 2024-08-14: qty 10, 5d supply, fill #0

## 2024-08-14 NOTE — Addendum Note (Signed)
 Addended by: FREYA BASCOM CROME on: 08/14/2024 10:54 AM   Modules accepted: Orders

## 2024-08-14 NOTE — Progress Notes (Signed)
 "  Acute Office Visit  Patient ID: Anita Tanner, female    DOB: 2000/09/25, 23 y.o.   MRN: [id number]  PCP: Alvan Dorothyann BIRCH, MD  Chief Complaint  Patient presents with   Urinary Tract Infection    Subjective:     HPI  Discussed the use of AI scribe software for clinical note transcription with the patient, who gave verbal consent to proceed.  History of Present Illness Anita Tanner is a 23 year old female who presents with urinary symptoms and concerns about a urethral cyst.  Dysuria and lower urinary tract symptoms - Pressure and pinching sensation during urination since Thursday night - Sensation of pressure sitting on bladder, persisting after urination - Urgency without actual need to urinate - No burning with urination, which differs from prior urinary tract infection symptoms - Small amount of pink discharge when wiping, initially thought to be vaginal in origin  Urethral lesion - Small cyst or tumor at the opening of the urethra, previously identified during an exam - No observed changes in the cyst - Unable to follow up on the lesion - Concern that current pressure symptoms may be related to the cyst  Vaginal infections and discharge - History of recurrent yeast infections and bacterial vaginosis - One episode of bacterial vaginosis this year and one last year - Several yeast infections this year - Interest in testing for yeast or bacterial overgrowth  Genital herpes - History of herpes, confirmed by previous testing  Sensitivity to vaginal treatments - Sensitivity to Monistat, which worsened symptoms during a previous yeast infection  Preventative measures for vaginal health - Interest in exploring probiotics and boric acid suppositories for prevention   ROS     Objective:    BP 126/87   Pulse (!) 110   Ht 5' 3 (1.6 m)   Wt 129 lb (58.5 kg)   SpO2 94%   BMI 22.85 kg/m    Physical Exam Vitals reviewed.   Constitutional:      Appearance: Normal appearance.  HENT:     Head: Normocephalic.  Pulmonary:     Effort: Pulmonary effort is normal.  Genitourinary:     Comments: Are is raised and larger on the right but still circle size.  No erythema. No abnormal vaginal d/c Neurological:     Mental Status: She is alert and oriented to person, place, and time.  Psychiatric:        Mood and Affect: Mood normal.        Behavior: Behavior normal.       Results for orders placed or performed in visit on 08/14/24  POCT URINALYSIS DIP (CLINITEK)  Result Value Ref Range   Color, UA yellow yellow   Clarity, UA cloudy (A) clear   Glucose, UA negative negative mg/dL   Bilirubin, UA negative negative   Ketones, POC UA negative negative mg/dL   Spec Grav, UA 8.974 8.989 - 1.025   Blood, UA trace-intact (A) negative   pH, UA 7.5 5.0 - 8.0   POC PROTEIN,UA trace negative, trace   Urobilinogen, UA 0.2 0.2 or 1.0 E.U./dL   Nitrite, UA Positive (A) Negative   Leukocytes, UA Moderate (2+) (A) Negative       Assessment & Plan:   Problem List Items Addressed This Visit   None Visit Diagnoses       Cloudy urine    -  Primary   Relevant Medications   nitrofurantoin , macrocrystal-monohydrate, (MACROBID ) 100 MG capsule  Other Relevant Orders   POCT URINALYSIS DIP (CLINITEK) (Completed)   Urine Culture     Acute cystitis without hematuria       Relevant Medications   nitrofurantoin , macrocrystal-monohydrate, (MACROBID ) 100 MG capsule   Other Relevant Orders   Urine Culture     Urethral cyst           Assessment and Plan Assessment & Plan Acute urinary tract infection Positive nitrates and moderate leukocytes in urine dipstick confirmed UTI. - Prescribed antibiotics for UTI. - Performed swab to check for yeast or bacterial overgrowth.  Urethral cyst Small cyst at urethral opening, potential for obstruction if enlarged. - Monitor cyst size and symptoms during annual checkups. -  Consider referral to urologist if cyst enlarges or causes obstruction.  Recurrent vulvovaginal infections Recurrent yeast infections and bacterial vaginosis with sensitivity to treatments. - Recommended probiotics for vaginal health, [place] - Suggested boric acid suppositories for prevention. - Advised use of coconut oil as natural lubricant.    Meds ordered this encounter  Medications   nitrofurantoin , macrocrystal-monohydrate, (MACROBID ) 100 MG capsule    Sig: Take 1 capsule (100 mg total) by mouth 2 (two) times daily.    Dispense:  10 capsule    Refill:  0    No follow-ups on file.  Dorothyann Byars, MD [facility]   "

## 2024-08-15 ENCOUNTER — Ambulatory Visit: Payer: Self-pay | Admitting: Family Medicine

## 2024-08-15 LAB — WET PREP FOR TRICH, YEAST, CLUE
Clue Cell Exam: NEGATIVE
Trichomonas Exam: NEGATIVE
Yeast Exam: NEGATIVE

## 2024-08-15 NOTE — Progress Notes (Signed)
 Wet prep is normal. No sign of yeast or bacteria.

## 2024-08-18 LAB — URINE CULTURE

## 2024-08-31 ENCOUNTER — Encounter: Payer: Self-pay | Admitting: Family Medicine

## 2024-09-09 ENCOUNTER — Encounter: Payer: Self-pay | Admitting: *Deleted

## 2024-09-09 ENCOUNTER — Ambulatory Visit
Admission: EM | Admit: 2024-09-09 | Discharge: 2024-09-09 | Disposition: A | Discharge status: Home / Self Care | Attending: Family Medicine | Admitting: Family Medicine

## 2024-09-09 DIAGNOSIS — N3001 Acute cystitis with hematuria: Secondary | ICD-10-CM

## 2024-09-09 LAB — POCT URINALYSIS DIP (MANUAL ENTRY)
Bilirubin, UA: NEGATIVE
Glucose, UA: NEGATIVE mg/dL
Nitrite, UA: POSITIVE — AB
Protein Ur, POC: 30 mg/dL — AB
Spec Grav, UA: 1.025
Urobilinogen, UA: 0.2 U/dL
pH, UA: 6.5

## 2024-09-09 MED ORDER — CEFDINIR 300 MG PO CAPS: 300.0000 mg | ORAL_CAPSULE | Freq: Two times a day (BID) | ORAL | 0 refills | Status: AC

## 2024-09-09 NOTE — Discharge Instructions (Addendum)
 Advised patient take medication as directed with food to completion.  Encouraged to increase daily water intake to 64 ounces per day while taking this medication.  Advised we will follow-up with urine culture results once received.  Advised if symptoms worsen and are unresolved please follow-up with your PCP or here for further evaluation.

## 2024-09-09 NOTE — ED Triage Notes (Signed)
 Patient states pressure when voiding and hematuria since yesterday as well as lower abdominal cramping.

## 2024-09-09 NOTE — ED Provider Notes (Signed)
 " Anita Tanner    CSN: [id number] Arrival date & time: 09/09/24  1133      History   Chief Complaint Chief Complaint  Patient presents with   Hematuria   Dysuria   Abdominal Pain    HPI Anita Tanner is a 23 y.o. female.   HPI 24 year old female presents with pressure when voiding and hematuria.  Additionally, patient reports lower abdominal cramping.  PMH significant for ADD, MDD, and iron deficiency anemia due to chronic blood loss.  Past Medical History:  Diagnosis Date   ADD (attention deficit disorder)    Anxiety    Chest pain    Depression    Fracture of wrist    left 2008, bilat 2011, right 2013    Patient Active Problem List   Diagnosis Date Noted   Elevated bilirubin 07/15/2020   Gallbladder polyp 07/12/2020   Iron deficiency anemia due to chronic blood loss 12/16/2016   MDD (major depressive disorder), recurrent severe, without psychosis (HCC) 04/10/2015   Migraine headache 01/05/2014   Anxiety and depression 08/09/2012   ADHD (attention deficit hyperactivity disorder) 05/16/2012   Allergic rhinitis 01/05/2011   EPISTAXIS, RECURRENT 01/05/2011   Constipation 06/30/2010    Past Surgical History:  Procedure Laterality Date   TYMPANOSTOMY TUBE PLACEMENT  2002    OB History   No obstetric history on file.      Home Medications    Prior to Admission medications  Medication Sig Start Date End Date Taking? Authorizing Provider  cefdinir  (OMNICEF ) 300 MG capsule Take 1 capsule (300 mg total) by mouth 2 (two) times daily for 7 days. 09/09/24 09/16/24 Yes Teddy Sharper, FNP  amphetamine -dextroamphetamine  (ADDERALL  XR) 20 MG 24 hr capsule Take 1 capsule (20 mg total) by mouth every morning. 08/12/24   Metheney, Annina D, MD  ipratropium (ATROVENT ) 0.03 % nasal spray Place 2 sprays into both nostrils 3 (three) times daily as needed for rhinitis. 05/16/19   Fawze, Mina A, PA-C  levonorgestrel  (KYLEENA ) 19.5 MG IUD 1 each by Intrauterine  route once for 1 dose. 07/07/22 12/08/23  Willo Mini, NP  valACYclovir  (VALTREX ) 500 MG tablet Take 1 tablet daily for prevention, increase to twice daily for x 3 days with symptoms. 01/11/24       Family History Family History  Problem Relation Age of Onset   Hyperlipidemia Mother    Depression Mother    Anxiety disorder Mother    Coronary artery disease Father    ADD / ADHD Father    Hyperlipidemia Father    Heart disease Father    Sleep apnea Father     Social History Social History[1]   Allergies   Prozac  [fluoxetine  hcl] and Viibryd  [vilazodone  hcl]   Review of Systems Review of Systems  Genitourinary:  Positive for hematuria.  All other systems reviewed and are negative.    Physical Exam Triage Vital Signs ED Triage Vitals  Encounter Vitals Group     BP      Girls Systolic BP Percentile      Girls Diastolic BP Percentile      Boys Systolic BP Percentile      Boys Diastolic BP Percentile      Pulse      Resp      Temp      Temp src      SpO2      Weight      Height      Head Circumference  Peak Flow      Pain Score      Pain Loc      Pain Education      Exclude from Growth Chart    No data found.  Updated Vital Signs BP 112/76 (BP Location: Right Arm)   Pulse 73   Temp 98 F (36.7 C) (Oral)   Resp 18   Wt 129 lb 1.6 oz (58.6 kg)   LMP 08/22/2024 (Exact Date)   SpO2 97%   BMI 22.87 kg/m    Physical Exam Vitals and nursing note reviewed.  Constitutional:      Appearance: Normal appearance. She is normal weight.  HENT:     Head: Normocephalic and atraumatic.     Mouth/Throat:     Mouth: Mucous membranes are moist.  Eyes:     Extraocular Movements: Extraocular movements intact.     Conjunctiva/sclera: Conjunctivae normal.     Pupils: Pupils are equal, round, and reactive to light.  Cardiovascular:     Rate and Rhythm: Normal rate and regular rhythm.     Pulses: Normal pulses.     Heart sounds: Normal heart sounds.  Pulmonary:      Effort: Pulmonary effort is normal.     Breath sounds: Normal breath sounds. No wheezing, rhonchi or rales.  Abdominal:     Tenderness: There is no right CVA tenderness or left CVA tenderness.  Musculoskeletal:        General: Normal range of motion.  Skin:    General: Skin is warm and dry.  Neurological:     General: No focal deficit present.     Mental Status: She is alert and oriented to person, place, and time. Mental status is at baseline.  Psychiatric:        Mood and Affect: Mood normal.        Behavior: Behavior normal.      UC Treatments / Results  Labs (all labs ordered are listed, but only abnormal results are displayed) Labs Reviewed  POCT URINALYSIS DIP (MANUAL ENTRY) - Abnormal; Notable for the following components:      Result Value   Color, UA other (*)    Clarity, UA cloudy (*)    Ketones, POC UA moderate (40) (*)    Blood, UA moderate (*)    Protein Ur, POC =30 (*)    Nitrite, UA Positive (*)    Leukocytes, UA Moderate (2+) (*)    All other components within normal limits  URINE CULTURE    EKG   Radiology No results found.  Procedures Procedures (including critical Tanner time)  Medications Ordered in UC Medications - No data to display  Initial Impression / Assessment and Plan / UC Course  I have reviewed the triage vital signs and the nursing notes.  Pertinent labs & imaging results that were available during my Tanner of the patient were reviewed by me and considered in my medical decision making (see chart for details).     MDM: 1.  Acute cystitis with hematuria-Rx'd cefdinir  300 mg capsule: Take 1 capsule twice daily x 7 days, UA revealed above, urine culture ordered. Advised patient take medication as directed with food to completion.  Encouraged to increase daily water intake to 64 ounces per day while taking this medication.  Advised we will follow-up with urine culture results once received.  Advised if symptoms worsen and are  unresolved please follow-up with your PCP or here for further evaluation.  Patient discharged home, hemodynamically stable.  Final Clinical Impressions(s) / UC Diagnoses   Final diagnoses:  Acute cystitis with hematuria     Discharge Instructions      Advised patient take medication as directed with food to completion.  Encouraged to increase daily water intake to 64 ounces per day while taking this medication.  Advised we will follow-up with urine culture results once received.  Advised if symptoms worsen and are unresolved please follow-up with your PCP or here for further evaluation.     ED Prescriptions     Medication Sig Dispense Auth. Provider   cefdinir  (OMNICEF ) 300 MG capsule Take 1 capsule (300 mg total) by mouth 2 (two) times daily for 7 days. 14 capsule Barak Bialecki, FNP      PDMP not reviewed this encounter.    [1]  Social History Tobacco Use   Smoking status: Former    Current packs/day: 0.00    Types: Cigarettes    Quit date: 2018    Years since quitting: 8.0   Smokeless tobacco: Never  Vaping Use   Vaping status: Every Day  Substance Use Topics   Alcohol use: No   Drug use: No     Teddy Sharper, FNP 09/09/24 1246  "

## 2024-09-12 ENCOUNTER — Ambulatory Visit (HOSPITAL_COMMUNITY): Payer: Self-pay

## 2024-09-12 LAB — URINE CULTURE: Culture: >=100000 — AB
